# Patient Record
Sex: Female | Born: 1970 | Race: Black or African American | Hispanic: No | Marital: Married | Smoking: Never smoker
Health system: Southern US, Community
[De-identification: ages and names within clinical notes are randomized; demographics above are authoritative.]

## PROBLEM LIST (undated history)

## (undated) DIAGNOSIS — K219 Gastro-esophageal reflux disease without esophagitis: Secondary | ICD-10-CM

## (undated) DIAGNOSIS — I10 Essential (primary) hypertension: Secondary | ICD-10-CM

## (undated) DIAGNOSIS — R7303 Prediabetes: Secondary | ICD-10-CM

## (undated) DIAGNOSIS — D219 Benign neoplasm of connective and other soft tissue, unspecified: Secondary | ICD-10-CM

## (undated) HISTORY — PX: COLONOSCOPY: SHX5424

---

## 2000-12-27 HISTORY — PX: CHOLECYSTECTOMY: SHX55

## 2006-12-27 HISTORY — PX: INTRAUTERINE DEVICE (IUD) INSERTION: SHX5877

## 2008-07-23 ENCOUNTER — Emergency Department (HOSPITAL_COMMUNITY): Admission: EM | Admit: 2008-07-23 | Discharge: 2008-07-23 | Payer: Self-pay | Admitting: Emergency Medicine

## 2008-10-09 ENCOUNTER — Ambulatory Visit: Payer: Self-pay | Admitting: Family Medicine

## 2008-10-09 LAB — CONVERTED CEMR LAB
Basophils Absolute: 0 10*3/uL (ref 0.0–0.1)
Calcium: 8.8 mg/dL (ref 8.4–10.5)
Cholesterol: 168 mg/dL (ref 0–200)
Eosinophils Absolute: 0.1 10*3/uL (ref 0.0–0.7)
Eosinophils Relative: 1 % (ref 0–5)
HCT: 39.8 % (ref 36.0–46.0)
HDL: 41 mg/dL (ref >39)
Lymphocytes Relative: 46 % (ref 12–46)
Lymphs Abs: 2.2 10*3/uL (ref 0.7–4.0)
Neutrophils Relative %: 44 % (ref 43–77)
Platelets: 410 10*3/uL — ABNORMAL HIGH (ref 150–400)
Potassium: 4.2 meq/L (ref 3.5–5.3)
RDW: 14.2 % (ref 11.5–15.5)
Sodium: 140 meq/L (ref 135–145)
Total CHOL/HDL Ratio: 4.1
VLDL: 20 mg/dL (ref 0–40)
WBC: 4.9 10*3/uL (ref 4.0–10.5)

## 2008-11-27 ENCOUNTER — Other Ambulatory Visit: Admission: RE | Admit: 2008-11-27 | Discharge: 2008-11-27 | Payer: Self-pay | Admitting: Family Medicine

## 2008-11-27 ENCOUNTER — Ambulatory Visit: Payer: Self-pay | Admitting: Family Medicine

## 2008-11-27 ENCOUNTER — Encounter: Payer: Self-pay | Admitting: Family Medicine

## 2009-01-29 ENCOUNTER — Ambulatory Visit: Payer: Self-pay | Admitting: Family Medicine

## 2009-01-29 DIAGNOSIS — H6120 Impacted cerumen, unspecified ear: Secondary | ICD-10-CM | POA: Insufficient documentation

## 2009-02-06 ENCOUNTER — Ambulatory Visit: Payer: Self-pay | Admitting: Family Medicine

## 2009-03-10 ENCOUNTER — Telehealth: Payer: Self-pay | Admitting: Family Medicine

## 2009-03-17 ENCOUNTER — Encounter: Payer: Self-pay | Admitting: Family Medicine

## 2009-04-02 ENCOUNTER — Ambulatory Visit: Payer: Self-pay | Admitting: Family Medicine

## 2011-09-13 ENCOUNTER — Encounter: Payer: Self-pay | Admitting: Family Medicine

## 2011-09-14 ENCOUNTER — Ambulatory Visit (INDEPENDENT_AMBULATORY_CARE_PROVIDER_SITE_OTHER): Payer: Managed Care, Other (non HMO) | Admitting: Family Medicine

## 2011-09-14 ENCOUNTER — Encounter: Payer: Self-pay | Admitting: Family Medicine

## 2011-09-14 ENCOUNTER — Other Ambulatory Visit: Payer: Self-pay | Admitting: Family Medicine

## 2011-09-14 VITALS — BP 130/90 | HR 75 | Resp 16 | Ht 63.0 in | Wt 230.0 lb

## 2011-09-14 DIAGNOSIS — H6120 Impacted cerumen, unspecified ear: Secondary | ICD-10-CM

## 2011-09-14 DIAGNOSIS — M899 Disorder of bone, unspecified: Secondary | ICD-10-CM

## 2011-09-14 DIAGNOSIS — R7301 Impaired fasting glucose: Secondary | ICD-10-CM

## 2011-09-14 DIAGNOSIS — E559 Vitamin D deficiency, unspecified: Secondary | ICD-10-CM

## 2011-09-14 DIAGNOSIS — Z1211 Encounter for screening for malignant neoplasm of colon: Secondary | ICD-10-CM

## 2011-09-14 DIAGNOSIS — E669 Obesity, unspecified: Secondary | ICD-10-CM

## 2011-09-14 DIAGNOSIS — Z23 Encounter for immunization: Secondary | ICD-10-CM

## 2011-09-14 DIAGNOSIS — R5383 Other fatigue: Secondary | ICD-10-CM

## 2011-09-14 DIAGNOSIS — Z139 Encounter for screening, unspecified: Secondary | ICD-10-CM

## 2011-09-14 DIAGNOSIS — R5381 Other malaise: Secondary | ICD-10-CM

## 2011-09-14 DIAGNOSIS — Z0000 Encounter for general adult medical examination without abnormal findings: Secondary | ICD-10-CM

## 2011-09-14 DIAGNOSIS — M949 Disorder of cartilage, unspecified: Secondary | ICD-10-CM

## 2011-09-14 LAB — HEMOCCULT GUIAC POC 1CARD (OFFICE): Fecal Occult Blood, POC: NEGATIVE

## 2011-09-14 NOTE — Patient Instructions (Addendum)
F/u end January.  It is important that you exercise regularly at least 30 minutes 5 times a week. If you develop chest pain, have severe difficulty breathing, or feel very tired, stop exercising immediately and seek medical attention   A healthy diet is rich in fruit, vegetables and whole grains. Poultry fish, nuts and beans are a healthy choice for protein rather then red meat. A low sodium diet and drinking 64 ounces of water daily is generally recommended. Oils and sweet should be limited. Carbohydrates especially for those who are diabetic or overweight, should be limited to 30-45 gram per meal. It is important to eat on a regular schedule, at least 3 times daily. Snacks should be primarily fruits, vegetables or nuts.   Weight loss goal of approximately 12 pounds in the next 4 months.  Log in to the computer app "My fitness pal" this is free and helps with calorie counting.  We will provide a 1500 cal diet sheet.  You will be referred for individual counseling for weight loss at the hospital.  Fasting labs asap  Mammogram to be scheduled on the way out , and it is important for you to start monthly breast exams

## 2011-09-15 LAB — CBC WITH DIFFERENTIAL/PLATELET
Basophils Absolute: 0 10*3/uL (ref 0.0–0.1)
Basophils Relative: 0 % (ref 0–1)
Eosinophils Absolute: 0.1 10*3/uL (ref 0.0–0.7)
Eosinophils Relative: 1 % (ref 0–5)
HCT: 42.5 % (ref 36.0–46.0)
MCH: 29.2 pg (ref 26.0–34.0)
MCHC: 33.6 g/dL (ref 30.0–36.0)
MCV: 86.9 fL (ref 78.0–100.0)
Monocytes Absolute: 0.4 10*3/uL (ref 0.1–1.0)
Platelets: 415 10*3/uL — ABNORMAL HIGH (ref 150–400)
RDW: 13.2 % (ref 11.5–15.5)

## 2011-09-15 LAB — LIPID PANEL
HDL: 38 mg/dL — ABNORMAL LOW (ref >39)
LDL Cholesterol: 90 mg/dL (ref 0–99)
Total CHOL/HDL Ratio: 3.8 Ratio
Triglycerides: 74 mg/dL (ref <150)
VLDL: 15 mg/dL (ref 0–40)

## 2011-09-15 LAB — BASIC METABOLIC PANEL
CO2: 28 mEq/L (ref 19–32)
Calcium: 9.5 mg/dL (ref 8.4–10.5)
Creat: 0.79 mg/dL (ref 0.50–1.10)

## 2011-09-15 LAB — HEMOGLOBIN A1C: Mean Plasma Glucose: 117 mg/dL — ABNORMAL HIGH (ref <117)

## 2011-09-16 ENCOUNTER — Ambulatory Visit (HOSPITAL_COMMUNITY)
Admission: RE | Admit: 2011-09-16 | Discharge: 2011-09-16 | Disposition: A | Discharge status: Home / Self Care | Payer: Managed Care, Other (non HMO) | Source: Ambulatory Visit | Attending: Family Medicine | Admitting: Family Medicine

## 2011-09-16 DIAGNOSIS — Z139 Encounter for screening, unspecified: Secondary | ICD-10-CM

## 2011-09-16 DIAGNOSIS — E559 Vitamin D deficiency, unspecified: Secondary | ICD-10-CM | POA: Insufficient documentation

## 2011-09-16 DIAGNOSIS — Z1231 Encounter for screening mammogram for malignant neoplasm of breast: Secondary | ICD-10-CM | POA: Insufficient documentation

## 2011-09-16 MED ORDER — VITAMIN D3 1.25 MG (50000 UT) PO CAPS: 50000.0000 [IU] | ORAL_CAPSULE | ORAL | Status: DC

## 2011-09-16 NOTE — Assessment & Plan Note (Signed)
New diagnosis, will start replacement

## 2011-09-16 NOTE — Progress Notes (Signed)
  Subjective:    Patient ID: Tasha Valencia, female    DOB: March 08, 1971, 40 y.o.   MRN: [id number]  HPI The PT is here for annual exam. She has not been seen for over 1 year, and her only known health concern is morbid obesity. She has been attempting dietary change with a little weight loss success, she does not exercise regularly. She intends to work at changing both There are no new concerns.  There are no specific complaints       Review of Systems Denies recent fever or chills. Denies sinus pressure, nasal congestion, ear pain or sore throat. Denies chest congestion, productive cough or wheezing. Denies chest pains, palpitations and leg swelling Denies abdominal pain, nausea, vomiting,diarrhea or constipation.   Denies dysuria, frequency, hesitancy or incontinence. Denies joint pain, swelling and limitation in mobility. Denies headaches, seizures, numbness, or tingling. Denies depression, anxiety or insomnia. Denies skin break down or rash.        Objective:   Physical Exam Pleasant well nourished female, alert and oriented x 3, in no cardio-pulmonary distress. Afebrile. HEENT No facial trauma or asymetry. Sinuses non tender.  EOMI, PERTL, fundoscopic exam is normal, no hemorhage or exudate.  External ears normal, tympanic membranes clear. Oropharynx moist, no exudate, good dentition. Neck: supple, no adenopathy,JVD or thyromegaly.No bruits.  Chest: Clear to ascultation bilaterally.No crackles or wheezes. Non tender to palpation  Breast: No asymetry,no masses. No nipple discharge or inversion. No axillary or supraclavicular adenopathy  Cardiovascular system; Heart sounds normal,  S1 and  S2 ,no S3.  No murmur, or thrill. Apical beat not displaced Peripheral pulses normal.  Abdomen: Soft, non tender, no organomegaly or masses. No bruits. Bowel sounds normal. No guarding, tenderness or rebound.  Rectal:  No mass. Guaiac negative stool.  GU: External  genitalia normal. No lesions. Vaginal canal normal.No discharge. Uterus normal size, no adnexal masses, no cervical motion or adnexal tenderness.  Musculoskeletal exam: Full ROM of spine, hips , shoulders and knees. No deformity ,swelling or crepitus noted. No muscle wasting or atrophy.   Neurologic: Cranial nerves 2 to 12 intact. Power, tone ,sensation and reflexes normal throughout. No disturbance in gait. No tremor.  Skin: Intact, no ulceration, erythema , scaling or rash noted. Pigmentation normal throughout  Psych; Normal mood and affect. Judgement and concentration normal        Assessment & Plan:

## 2011-09-16 NOTE — Assessment & Plan Note (Signed)
Advised use of softener, and pt to have ear irrigation done at another visit

## 2011-09-16 NOTE — Assessment & Plan Note (Signed)
Counseled re lifestyle change needed for weight loss, pt is motivated

## 2011-09-17 ENCOUNTER — Other Ambulatory Visit: Payer: Self-pay | Admitting: Family Medicine

## 2011-09-17 LAB — T3, FREE: T3, Free: 2.9 pg/mL (ref 2.3–4.2)

## 2011-09-17 LAB — T4: T4, Total: 9.3 ug/dL (ref 5.0–12.5)

## 2011-09-24 LAB — URINALYSIS, ROUTINE W REFLEX MICROSCOPIC
Bilirubin Urine: NEGATIVE
Ketones, ur: NEGATIVE
Protein, ur: NEGATIVE
Urobilinogen, UA: 0.2

## 2011-10-01 NOTE — Progress Notes (Signed)
Addended by: Abner Greenspan on: 10/01/2011 01:27 PM   Modules accepted: Orders

## 2011-11-02 ENCOUNTER — Encounter (HOSPITAL_COMMUNITY): Payer: Self-pay | Admitting: Dietician

## 2011-11-02 NOTE — Progress Notes (Signed)
Outpatient Initial Nutrition Assessment  Date:11/02/2011   Time: 1600  Referring Physician: Dr Lodema Hong [facility]) Reason for Visit: Obesity/ Weight loss  Nutrition Assessment:  Ht: 63" Wt: 233# IBW: 115# %IBW: 203% UBW: 233# %UBW: 100# Goal Weight: 199# BMI: 41.28 Weight hx: Pt's highest weight is current weight of 233#. Lowest weight was 219# about 2 years ago. Pt reports that weight has been fluctuating, but trending up for the past 2 years.   Estimated nutritional needs:  1949-2126 kcals daily, 85-106 grams protein daily, 1.9-2.1 L fluid daily  PMH: Morbid obesity Medications: None, per pt Labs: BP: 130/90, Hgb A1c: 5.7, Total Cholesterol: 143, HDL: 38, LDL: 90, Triglycerides: 74  Nutrition hx/habits: Pt desires weight loss. Reports that she has tried different weight loss strategies over the years, but never found anything that stuck or had long term success. Pt has tried diet pills and cut out sodas. Pt also tried Weight Watchers 8 years ago, but discontinued the membership due to financial obligations.  Pt also struggles with commitment to physical activity. Reports during the summer she likes to walk at [facility]. She has a fitness center at her apartment complex that she has started using, but she does not feel motivated to exercise. Reports that she completed 20 minutes on the treadmill and 10 minutes using weights yesterday.  24 hour diet recall: Wake up 6:45-7AM; Breakfast (9:30 AM): bowl of cereal (honey nut cheerios, Trix, Honey Comb) and 2% milk; Lunch (1:30-2PM): ham and cheese sandwich and potato chips OR out ([employer], [place], or pizza); Dinner (6-7 PM): bbq meat with corn or mashed potatoes OR spaghetti/lasagna with salad; Snack (before 9 PM): chips or ice cream cone. Beverages: 2 glasses of sweet tea daily and 4 bottles of water daily. Reports to eating out 3 x a week.   Nutrition Diagnosis: Involuntary weight gain r/t sedentary lifestyle, diet high in  fat, sodium, and refined sugars AEB 19#, 6% weight gain x 2 years, BMI: 41.28.  Nutrition Intervention Nutrition rx: 1500 calorie NAS, no added sugar diet; 3 meals/day; calorie free beverages only; fruit or vegetables for snacks; 30 minutes physical activity 5 times per week   Education/Counseling Provided: Educated pt on weight management strategies including portion control, lean meats, increased fruit and vegetable intake, healthy food preparation methods, plate method, healthy substitutions/ alternatives, and importance of increased physical activity along with diet interventions to assist with weight loss. Counseled on slow, moderate weight loss (1-2#/ week- will take about 1 year to achieve her goal of under 200#). Discussed importance of accountability to keep on track. Showed pt functionality of My Fitness Pal. Provided 1500 Calorie Diet, Weight Loss Tips, and Portion Distortion handouts.   Understanding, Motivation, Ability to Follow Recommendations: Fair. Pt receptive to recommendations, but will need to include regular physical activity into her lifestyle to achieve weight loss goals.   Monitoring and Evaluation: Goals: 1) 1-2 # weight loss per week; 2) 30 minutes of physical activity 5 x per week  Recommendations: 1) For weight loss: 1449-1626 kcals daily; 2) Keep food and exercise record (ex. My Fitness Pal); 3) Consider gym membership  F/U: PRN; RD contact information provided  Orlene Plum, RD  11/02/2011  Time: 1600

## 2012-01-12 ENCOUNTER — Encounter: Payer: Self-pay | Admitting: Family Medicine

## 2012-01-13 ENCOUNTER — Ambulatory Visit: Payer: Medicare HMO | Admitting: Family Medicine

## 2012-02-07 ENCOUNTER — Encounter: Payer: Self-pay | Admitting: Family Medicine

## 2012-02-07 ENCOUNTER — Ambulatory Visit (INDEPENDENT_AMBULATORY_CARE_PROVIDER_SITE_OTHER): Payer: Managed Care, Other (non HMO) | Admitting: Family Medicine

## 2012-02-07 VITALS — BP 130/90 | HR 62 | Resp 16 | Ht 63.0 in | Wt 239.0 lb

## 2012-02-07 DIAGNOSIS — E785 Hyperlipidemia, unspecified: Secondary | ICD-10-CM

## 2012-02-07 DIAGNOSIS — R7309 Other abnormal glucose: Secondary | ICD-10-CM

## 2012-02-07 DIAGNOSIS — I10 Essential (primary) hypertension: Secondary | ICD-10-CM | POA: Insufficient documentation

## 2012-02-07 DIAGNOSIS — R7303 Prediabetes: Secondary | ICD-10-CM | POA: Insufficient documentation

## 2012-02-07 DIAGNOSIS — R030 Elevated blood-pressure reading, without diagnosis of hypertension: Secondary | ICD-10-CM

## 2012-02-07 DIAGNOSIS — IMO0001 Reserved for inherently not codable concepts without codable children: Secondary | ICD-10-CM

## 2012-02-07 DIAGNOSIS — E559 Vitamin D deficiency, unspecified: Secondary | ICD-10-CM

## 2012-02-07 NOTE — Patient Instructions (Signed)
CPE  in 3 month and 3 weeks  Start phentermine half once daily  End of  March hBa1C, vit D and tSH non fasting  It is important that you exercise regularly at least 30 minutes 5 times a week.Working up to 60 mins 5 days per week. If you develop chest pain, have severe difficulty breathing, or feel very tired, stop exercising immediately and seek medical attention   A healthy diet is rich in fruit, vegetables and whole grains. Poultry fish, nuts and beans are a healthy choice for protein rather then red meat. A low sodium diet and drinking 64 ounces of water daily is generally recommended. Oils and sweet should be limited. Carbohydrates especially for those who are diabetic or overweight, should be limited to 34-45 gram per meal. It is important to eat on a regular schedule, at least 3 times daily. Snacks should be primarily fruits, vegetables or nuts.   Weight loss goal of at least 3 pounds per month.  pls follow low fat diet   pls use my fitness pal

## 2012-02-07 NOTE — Progress Notes (Signed)
  Subjective:    Patient ID: Tasha Valencia, female    DOB: 1971/12/03, 41 y.o.   MRN: [id number]  HPI The PT is here for follow up and re-evaluation of chronic medical conditions, medication management and review of any available recent lab and radiology data.  Preventive health is updated, specifically  Cancer screening and Immunization.   Pt recently had a health screen where her cholesterol was elevated as was her blood pressure, I was notified of this and her need to return to the office for ongoing care. She has not stuck with behavior modification and has gained weight , plans to change this     Review of Systems See HPI Denies recent fever or chills. Denies sinus pressure, nasal congestion, ear pain or sore throat. Denies chest congestion, productive cough or wheezing. Denies chest pains, palpitations and leg swelling Denies abdominal pain, nausea, vomiting,diarrhea or constipation.   Denies dysuria, frequency, hesitancy or incontinence. Denies joint pain, swelling and limitation in mobility. Denies headaches, seizures, numbness, or tingling. Denies depression, anxiety or insomnia. Denies skin break down or rash.        Objective:   Physical Exam  Patient alert and oriented and in no cardiopulmonary distress.  HEENT: No facial asymmetry, EOMI, no sinus tenderness,  oropharynx pink and moist.  Neck supple no adenopathy.  Chest: Clear to auscultation bilaterally.  CVS: S1, S2 no murmurs, no S3.  ABD: Soft non tender. Bowel sounds normal.  Ext: No edema  MS: Adequate ROM spine, shoulders, hips and knees.  Skin: Intact, no ulcerations or rash noted.  Psych: Good eye contact, normal affect. Memory intact not anxious or depressed appearing.  CNS: CN 2-12 intact, power, tone and sensation normal throughout.       Assessment & Plan:

## 2012-02-07 NOTE — Assessment & Plan Note (Signed)
Pt has low HDL, is morbidly obese and is prediabetic, the importance of regular exercise to improve this is discussed

## 2012-02-07 NOTE — Assessment & Plan Note (Signed)
Single elevated BP at this visit. DASH diet and the impt of regular exercise and weight loss is stressed

## 2012-02-07 NOTE — Assessment & Plan Note (Signed)
Deteriorated. Patient re-educated about  the importance of commitment to a  minimum of 150 minutes of exercise per week. The importance of healthy food choices with portion control discussed. Encouraged to start a food diary, count calories and to consider  joining a support group. Sample diet sheets offered. Goals set by the patient for the next several months.   Pt to resume phentermine half daily as a "kick start" also

## 2012-02-07 NOTE — Assessment & Plan Note (Signed)
On weekly vit D rept lab next month

## 2012-02-07 NOTE — Assessment & Plan Note (Addendum)
Pt has gained weight and has not stuck to behavioral modification, she understands the absolute need to do both Rept HBa1C next month

## 2012-02-10 ENCOUNTER — Other Ambulatory Visit: Payer: Self-pay

## 2012-02-10 ENCOUNTER — Telehealth: Payer: Self-pay | Admitting: Family Medicine

## 2012-02-10 MED ORDER — PHENTERMINE HCL 37.5 MG PO TABS: 37.5000 mg | ORAL_TABLET | Freq: Every day | ORAL | Status: DC

## 2012-02-10 NOTE — Telephone Encounter (Signed)
rx awaiting signature by dr.

## 2012-05-31 LAB — HEMOGLOBIN A1C
Hgb A1c MFr Bld: 5.4 % (ref <5.7)
Mean Plasma Glucose: 108 mg/dL (ref <117)

## 2012-05-31 LAB — TSH: TSH: 0.917 u[IU]/mL (ref 0.350–4.500)

## 2012-06-01 ENCOUNTER — Ambulatory Visit (INDEPENDENT_AMBULATORY_CARE_PROVIDER_SITE_OTHER): Payer: Managed Care, Other (non HMO) | Admitting: Family Medicine

## 2012-06-01 ENCOUNTER — Encounter: Payer: Self-pay | Admitting: Family Medicine

## 2012-06-01 ENCOUNTER — Other Ambulatory Visit (HOSPITAL_COMMUNITY)
Admission: RE | Admit: 2012-06-01 | Discharge: 2012-06-01 | Disposition: A | Discharge status: Home / Self Care | Payer: Managed Care, Other (non HMO) | Source: Ambulatory Visit | Attending: Family Medicine | Admitting: Family Medicine

## 2012-06-01 VITALS — BP 122/84 | HR 71 | Resp 16 | Ht 63.0 in | Wt 235.8 lb

## 2012-06-01 DIAGNOSIS — Z0000 Encounter for general adult medical examination without abnormal findings: Secondary | ICD-10-CM

## 2012-06-01 DIAGNOSIS — Z1211 Encounter for screening for malignant neoplasm of colon: Secondary | ICD-10-CM

## 2012-06-01 DIAGNOSIS — Z01419 Encounter for gynecological examination (general) (routine) without abnormal findings: Secondary | ICD-10-CM | POA: Insufficient documentation

## 2012-06-01 DIAGNOSIS — Z975 Presence of (intrauterine) contraceptive device: Secondary | ICD-10-CM

## 2012-06-01 DIAGNOSIS — R7309 Other abnormal glucose: Secondary | ICD-10-CM

## 2012-06-01 DIAGNOSIS — E559 Vitamin D deficiency, unspecified: Secondary | ICD-10-CM

## 2012-06-01 DIAGNOSIS — R7303 Prediabetes: Secondary | ICD-10-CM

## 2012-06-01 LAB — VITAMIN D 25 HYDROXY (VIT D DEFICIENCY, FRACTURES): Vit D, 25-Hydroxy: 22 ng/mL — ABNORMAL LOW (ref 30–89)

## 2012-06-01 LAB — POC HEMOCCULT BLD/STL (OFFICE/1-CARD/DIAGNOSTIC): Fecal Occult Blood, POC: NEGATIVE

## 2012-06-01 MED ORDER — ERGOCALCIFEROL 1.25 MG (50000 UT) PO CAPS: 50000.0000 [IU] | ORAL_CAPSULE | ORAL | Status: DC

## 2012-06-01 NOTE — Progress Notes (Signed)
  Subjective:    Patient ID: Tasha Valencia, female    DOB: 06/20/1971, 41 y.o.   MRN: [id number]  HPI The PT is here for annual exam and re-evaluation of chronic medical conditions, medication management and review of any available recent lab and radiology data.  Preventive health is updated, specifically  Cancer screening and Immunization.   Questions or concerns regarding consultations or procedures which the PT has had in the interim are  addressed. The PT denies any adverse reactions to current medications since the last visit.  There are no new concerns.  There are no specific complaints except that she is disappointed in her inability to stick with lifestyle changes to improve health to date, but will still work on this      Review of Systems See HPI Denies recent fever or chills. Denies sinus pressure, nasal congestion, ear pain or sore throat. Denies chest congestion, productive cough or wheezing. Denies chest pains, palpitations and leg swelling Denies abdominal pain, nausea, vomiting,diarrhea or constipation.   Denies dysuria, frequency, hesitancy or incontinence. Denies joint pain, swelling and limitation in mobility. Denies headaches, seizures, numbness, or tingling. Denies depression, anxiety or insomnia. Denies skin break down or rash.        Objective:   Physical Exam  Pleasant obese female, alert and oriented x 3, in no cardio-pulmonary distress. Afebrile. HEENT No facial trauma or asymetry. Sinuses non tender.  EOMI, PERTL, fundoscopic exam  no hemorhage or exudate.  External ears normal, tympanic membranes clear. Oropharynx moist, no exudate, good dentition. Neck: supple, no adenopathy,JVD or thyromegaly.No bruits.  Chest: Clear to ascultation bilaterally.No crackles or wheezes. Non tender to palpation  Breast: No asymetry,no masses. No nipple discharge or inversion. No axillary or supraclavicular adenopathy  Cardiovascular system; Heart sounds  normal,  S1 and  S2 ,no S3.  No murmur, or thrill. Apical beat not displaced Peripheral pulses normal.  Abdomen: Soft, non tender, no organomegaly or masses. No bruits. Bowel sounds normal. No guarding, tenderness or rebound.  Rectal:  No mass. Guaiac negative stool.  GU: External genitalia normal. No lesions. Vaginal canal normal.No discharge. Uterus normal size, no adnexal masses, no cervical motion or adnexal tenderness.  Musculoskeletal exam: Full ROM of spine, hips , shoulders and knees. No deformity ,swelling or crepitus noted. No muscle wasting or atrophy.   Neurologic: Cranial nerves 2 to 12 intact. Power, tone ,sensation and reflexes normal throughout. No disturbance in gait. No tremor.  Skin: Intact, no ulceration, erythema , scaling or rash noted. Pigmentation normal throughout  Psych; Normal mood and affect. Judgement and concentration normal       Assessment & Plan:

## 2012-06-01 NOTE — Patient Instructions (Signed)
F/u in 4 month  Congrats on normal blood sugar average, this is an improvement.  Please work on continued changes in eating to improve your health , as discussed.  Please commit to at least 30 minutes of exercise 5 days per week.  Weight loss goal of at least 2.5 pounds each month for the next 4 month  You are referred for IUD removal and re insertion   Vitamin D once weekly, also if your multivitamin does not have vit D in it start over the counter vitamiin D tablets 600iU daily after you finish the weekly script in 12 weeks  No labs before next visit

## 2012-06-03 NOTE — Assessment & Plan Note (Signed)
Counseled re importance of 30 minutes exercise daily, and change in nutrition to increase fruit and vegetable intake and also adequate water intake. Weight loss goals set. Safety also discussed specifically seat belt use

## 2012-06-03 NOTE — Assessment & Plan Note (Signed)
Importance of supplementation to normalize value stressed

## 2012-06-03 NOTE — Assessment & Plan Note (Signed)
Improved HBA1C

## 2012-10-25 ENCOUNTER — Other Ambulatory Visit: Payer: Self-pay | Admitting: Family Medicine

## 2012-10-25 DIAGNOSIS — Z139 Encounter for screening, unspecified: Secondary | ICD-10-CM

## 2012-10-31 ENCOUNTER — Ambulatory Visit (HOSPITAL_COMMUNITY)
Admission: RE | Admit: 2012-10-31 | Discharge: 2012-10-31 | Disposition: A | Discharge status: Home / Self Care | Payer: Managed Care, Other (non HMO) | Source: Ambulatory Visit | Attending: Family Medicine | Admitting: Family Medicine

## 2012-10-31 DIAGNOSIS — Z1231 Encounter for screening mammogram for malignant neoplasm of breast: Secondary | ICD-10-CM | POA: Insufficient documentation

## 2012-10-31 DIAGNOSIS — Z139 Encounter for screening, unspecified: Secondary | ICD-10-CM

## 2013-04-09 ENCOUNTER — Encounter: Payer: Self-pay | Admitting: Family Medicine

## 2013-04-09 ENCOUNTER — Ambulatory Visit (INDEPENDENT_AMBULATORY_CARE_PROVIDER_SITE_OTHER): Payer: Managed Care, Other (non HMO) | Admitting: Family Medicine

## 2013-04-09 VITALS — BP 140/94 | HR 86 | Resp 18 | Ht 63.0 in | Wt 241.1 lb

## 2013-04-09 DIAGNOSIS — R7309 Other abnormal glucose: Secondary | ICD-10-CM

## 2013-04-09 DIAGNOSIS — E785 Hyperlipidemia, unspecified: Secondary | ICD-10-CM

## 2013-04-09 DIAGNOSIS — E559 Vitamin D deficiency, unspecified: Secondary | ICD-10-CM

## 2013-04-09 DIAGNOSIS — R7303 Prediabetes: Secondary | ICD-10-CM

## 2013-04-09 DIAGNOSIS — IMO0001 Reserved for inherently not codable concepts without codable children: Secondary | ICD-10-CM

## 2013-04-09 DIAGNOSIS — R030 Elevated blood-pressure reading, without diagnosis of hypertension: Secondary | ICD-10-CM

## 2013-04-09 NOTE — Assessment & Plan Note (Signed)
Patient educated about the importance of limiting  Carbohydrate intake , the need to commit to daily physical activity for a minimum of 30 minutes , and to commit weight loss. The fact that changes in all these areas will reduce or eliminate all together the development of diabetes is stressed.   Updated lab needed 

## 2013-04-09 NOTE — Assessment & Plan Note (Signed)
Remains elevated at this visit, if still abnormal at next visit, pt to start meds, has been on meds in the past  DASH diet and commitment to daily physical activity for a minimum of 30 minutes discussed and encouraged, as a part of hypertension management. The importance of attaining a healthy weight is also discussed.

## 2013-04-09 NOTE — Assessment & Plan Note (Signed)
Updated lab needed,has been treated in the past

## 2013-04-09 NOTE — Progress Notes (Signed)
  Subjective:    Patient ID: Tasha Valencia, female    DOB: 09-18-1971, 42 y.o.   MRN: [id number]  HPI The PT is here for follow up and re-evaluation of chronic medical conditions, medication management and review of any available recent lab and radiology data.  Preventive health is updated, specifically  Cancer screening and Immunization.   Questions or concerns regarding consultations or procedures which the PT has had in the interim are  Addressed.States she was recently alerted through screening on her job that she may be diabetic, seems surprised though the fact that she has been prediabetic has been documented in the past. She has started exercising 3 days per week, diet is inconsistent Approx 10 mins is spent re educating about the direct relationship between lifestyle and the development of disease, in the hope that she will make positive changes The PT denies any adverse reactions to current medications since the last visit.  There are no new concerns.  There are no specific complaints       Review of Systems See HPI Denies recent fever or chills. Denies sinus pressure, nasal congestion, ear pain or sore throat. Denies chest congestion, productive cough or wheezing. Denies chest pains, palpitations and leg swelling Denies abdominal pain, nausea, vomiting,diarrhea or constipation.   Denies dysuria, frequency, hesitancy or incontinence. Denies joint pain, swelling and limitation in mobility. Denies headaches, seizures, numbness, or tingling. Denies depression, anxiety or insomnia. Denies skin break down or rash.        Objective:   Physical Exam Patient alert and oriented and in no cardiopulmonary distress.  HEENT: No facial asymmetry, EOMI, no sinus tenderness,  oropharynx pink and moist.  Neck supple no adenopathy.  Chest: Clear to auscultation bilaterally.  CVS: S1, S2 no murmurs, no S3.  ABD: Soft non tender. Bowel sounds normal.  Ext: No edema  MS: Adequate  ROM spine, shoulders, hips and knees.  Skin: Intact, no ulcerations or rash noted.  Psych: Good eye contact, normal affect. Memory intact not anxious or depressed appearing.  CNS: CN 2-12 intact, power, tone and sensation normal throughout.        Assessment & Plan:

## 2013-04-09 NOTE — Assessment & Plan Note (Signed)
Unchanged. Patient re-educated about  the importance of commitment to a  minimum of 150 minutes of exercise per week. The importance of healthy food choices with portion control discussed. Encouraged to start a food diary, count calories and to consider  joining a support group. Sample diet sheets offered. Goals set by the patient for the next several months.    

## 2013-04-09 NOTE — Assessment & Plan Note (Signed)
Hyperlipidemia:Low fat diet discussed and encouraged.  Updated lab asap 

## 2013-04-09 NOTE — Patient Instructions (Addendum)
F/u in 4 month   Please count calories to help with weight loss, goal is 1200 to 1500 cal per day    It is important that you exercise regularly at least 30 minutes 5 times a week. If you develop chest pain, have severe difficulty breathing, or feel very tired, stop exercising immediately and seek medical attention    Fasting lipid, chem 7, CBC, hBA1C , TSH, and vit D in the morning  All the best  Blood pressure today is also slightly elevated

## 2013-04-11 LAB — BASIC METABOLIC PANEL
CO2: 24 mEq/L (ref 19–32)
Glucose, Bld: 91 mg/dL (ref 70–99)
Potassium: 4.1 mEq/L (ref 3.5–5.3)
Sodium: 138 mEq/L (ref 135–145)

## 2013-04-11 LAB — CBC
HCT: 39.9 % (ref 36.0–46.0)
MCV: 82.8 fL (ref 78.0–100.0)
RDW: 13.6 % (ref 11.5–15.5)
WBC: 5 10*3/uL (ref 4.0–10.5)

## 2013-04-11 LAB — LIPID PANEL: Total CHOL/HDL Ratio: 3.7 Ratio

## 2013-04-11 LAB — TSH: TSH: 0.739 u[IU]/mL (ref 0.350–4.500)

## 2013-08-10 ENCOUNTER — Ambulatory Visit: Payer: Managed Care, Other (non HMO) | Admitting: Family Medicine

## 2013-08-10 ENCOUNTER — Encounter: Payer: Self-pay | Admitting: Family Medicine

## 2013-08-16 ENCOUNTER — Ambulatory Visit: Payer: Managed Care, Other (non HMO) | Admitting: Family Medicine

## 2013-09-19 ENCOUNTER — Other Ambulatory Visit: Payer: Self-pay

## 2013-09-19 DIAGNOSIS — Z1231 Encounter for screening mammogram for malignant neoplasm of breast: Secondary | ICD-10-CM

## 2013-10-15 ENCOUNTER — Encounter: Payer: Self-pay | Admitting: Family Medicine

## 2013-10-15 ENCOUNTER — Ambulatory Visit (INDEPENDENT_AMBULATORY_CARE_PROVIDER_SITE_OTHER): Payer: Managed Care, Other (non HMO) | Admitting: Family Medicine

## 2013-10-15 ENCOUNTER — Encounter (INDEPENDENT_AMBULATORY_CARE_PROVIDER_SITE_OTHER): Payer: Self-pay

## 2013-10-15 VITALS — BP 120/72 | HR 70 | Resp 18 | Ht 63.0 in | Wt 242.1 lb

## 2013-10-15 DIAGNOSIS — Z23 Encounter for immunization: Secondary | ICD-10-CM

## 2013-10-15 DIAGNOSIS — E785 Hyperlipidemia, unspecified: Secondary | ICD-10-CM

## 2013-10-15 DIAGNOSIS — E559 Vitamin D deficiency, unspecified: Secondary | ICD-10-CM

## 2013-10-15 DIAGNOSIS — R7309 Other abnormal glucose: Secondary | ICD-10-CM

## 2013-10-15 DIAGNOSIS — R7303 Prediabetes: Secondary | ICD-10-CM

## 2013-10-15 LAB — HEMOGLOBIN A1C: Mean Plasma Glucose: 114 mg/dL (ref <117)

## 2013-10-15 MED ORDER — PHENTERMINE HCL 37.5 MG PO TBDP: 1.0000 | ORAL_TABLET | Freq: Every day | ORAL | Status: DC

## 2013-10-15 NOTE — Progress Notes (Signed)
  Subjective:    Patient ID: Tasha Valencia, female    DOB: 03-03-1971, 42 y.o.   MRN: [id number]  HPI The PT is here for follow up and re-evaluation of chronic medical conditions,  and review of any available recent lab and radiology data.  Preventive health is updated, specifically  Cancer screening and Immunization.    Concerned about lack of weigh despite behavioral changes per repot , wants to work more aggressively on this as well as et medication help for a[ppetite suppress. Has no know heart history    Review of Systems See HPI Denies recent fever or chills. Denies sinus pressure, nasal congestion, ear pain or sore throat. Denies chest congestion, productive cough or wheezing. Denies chest pains, palpitations and leg swelling Denies abdominal pain, nausea, vomiting,diarrhea or constipation.   Denies dysuria, frequency, hesitancy or incontinence. Denies joint pain, swelling and limitation in mobility. Denies headaches, seizures, numbness, or tingling. Denies depression, anxiety or insomnia. Denies skin break down or rash.        Objective:   Physical Exam Patient alert and oriented and in no cardiopulmonary distress.  HEENT: No facial asymmetry, EOMI, no sinus tenderness,  oropharynx pink and moist.  Neck supple no adenopathy.  Chest: Clear to auscultation bilaterally.  CVS: S1, S2 no murmurs, no S3.  ABD: Soft non tender. Bowel sounds normal.  Ext: No edema  MS: Adequate ROM spine, shoulders, hips and knees.  Skin: Intact, no ulcerations or rash noted.  Psych: Good eye contact, normal affect. Memory intact not anxious or depressed appearing.  CNS: CN 2-12 intact, power, tone and sensation normal throughout.        Assessment & Plan:

## 2013-10-15 NOTE — Patient Instructions (Addendum)
F/u in 4 month, call if you need me before   Flu vaccine today  Eat at set times at least 3 times every day, all snacks need to be unprocessed fresh or frozen  Fruit or veg  60 ounces water daily  30 minutes total of physical activity every day  Weight loss goal of 4 to 5 pounds per month  Phentermine half daily  HBA1c , vit D level today

## 2013-10-16 ENCOUNTER — Encounter: Payer: Self-pay | Admitting: Family Medicine

## 2013-10-16 LAB — VITAMIN D 25 HYDROXY (VIT D DEFICIENCY, FRACTURES): Vit D, 25-Hydroxy: 29 ng/mL — ABNORMAL LOW (ref 30–89)

## 2013-11-01 ENCOUNTER — Ambulatory Visit
Admission: RE | Admit: 2013-11-01 | Discharge: 2013-11-01 | Disposition: A | Discharge status: Home / Self Care | Payer: 59 | Source: Ambulatory Visit

## 2013-11-01 DIAGNOSIS — Z1231 Encounter for screening mammogram for malignant neoplasm of breast: Secondary | ICD-10-CM

## 2013-12-10 NOTE — Assessment & Plan Note (Signed)
Low HDL otherwise good, needs to commit to daily exercise

## 2013-12-10 NOTE — Assessment & Plan Note (Signed)
Deteriorated. Patient re-educated about  the importance of commitment to a  minimum of 150 minutes of exercise per week. The importance of healthy food choices with portion control discussed. Encouraged to start a food diary, count calories and to consider  joining a support group. Sample diet sheets offered. Goals set by the patient for the next several months.    

## 2013-12-10 NOTE — Assessment & Plan Note (Signed)
HBA1C corrected when last check Patient educated about the importance of limiting  Carbohydrate intake , the need to commit to daily physical activity for a minimum of 30 minutes , and to commit weight loss. The fact that changes in all these areas will reduce or eliminate all together the development of diabetes is stressed.

## 2014-02-13 ENCOUNTER — Ambulatory Visit: Payer: Managed Care, Other (non HMO) | Admitting: Family Medicine

## 2014-06-26 ENCOUNTER — Encounter: Payer: 59 | Admitting: Family Medicine

## 2014-10-08 ENCOUNTER — Other Ambulatory Visit: Payer: Self-pay

## 2014-10-08 DIAGNOSIS — Z1231 Encounter for screening mammogram for malignant neoplasm of breast: Secondary | ICD-10-CM

## 2014-11-05 ENCOUNTER — Inpatient Hospital Stay: Admission: RE | Admit: 2014-11-05 | Payer: 59 | Source: Ambulatory Visit

## 2014-11-19 ENCOUNTER — Encounter: Payer: 59 | Admitting: Family Medicine

## 2014-11-19 ENCOUNTER — Encounter: Payer: Self-pay | Admitting: *Deleted

## 2014-11-27 ENCOUNTER — Ambulatory Visit
Admission: RE | Admit: 2014-11-27 | Discharge: 2014-11-27 | Disposition: A | Discharge status: Home / Self Care | Payer: 59 | Source: Ambulatory Visit

## 2014-11-27 DIAGNOSIS — Z1231 Encounter for screening mammogram for malignant neoplasm of breast: Secondary | ICD-10-CM

## 2015-06-18 ENCOUNTER — Other Ambulatory Visit: Payer: Self-pay | Admitting: Obstetrics & Gynecology

## 2015-08-20 ENCOUNTER — Encounter (HOSPITAL_BASED_OUTPATIENT_CLINIC_OR_DEPARTMENT_OTHER): Payer: Self-pay | Admitting: *Deleted

## 2015-08-20 NOTE — Progress Notes (Signed)
NPO AFTER MN.  ARRIVE AT 1030.  NEEDS HG AND URINE PREG.

## 2015-08-27 ENCOUNTER — Ambulatory Visit (HOSPITAL_BASED_OUTPATIENT_CLINIC_OR_DEPARTMENT_OTHER): Payer: Managed Care, Other (non HMO) | Admitting: Anesthesiology

## 2015-08-27 ENCOUNTER — Ambulatory Visit (HOSPITAL_BASED_OUTPATIENT_CLINIC_OR_DEPARTMENT_OTHER)
Admission: RE | Admit: 2015-08-27 | Discharge: 2015-08-27 | Disposition: A | Discharge status: Home / Self Care | Payer: Managed Care, Other (non HMO) | Source: Ambulatory Visit | Attending: Obstetrics & Gynecology | Admitting: Obstetrics & Gynecology

## 2015-08-27 ENCOUNTER — Encounter (HOSPITAL_BASED_OUTPATIENT_CLINIC_OR_DEPARTMENT_OTHER): Payer: Self-pay

## 2015-08-27 ENCOUNTER — Encounter (HOSPITAL_BASED_OUTPATIENT_CLINIC_OR_DEPARTMENT_OTHER)
Admission: RE | Disposition: A | Discharge status: Home / Self Care | Payer: Self-pay | Source: Ambulatory Visit | Attending: Obstetrics & Gynecology

## 2015-08-27 DIAGNOSIS — Z30432 Encounter for removal of intrauterine contraceptive device: Secondary | ICD-10-CM | POA: Diagnosis not present

## 2015-08-27 DIAGNOSIS — Z302 Encounter for sterilization: Secondary | ICD-10-CM | POA: Insufficient documentation

## 2015-08-27 HISTORY — PX: LAPAROSCOPIC TUBAL LIGATION: SHX1937

## 2015-08-27 HISTORY — PX: IUD REMOVAL: SHX5392

## 2015-08-27 LAB — POCT HEMOGLOBIN-HEMACUE: HEMOGLOBIN: 12.9 g/dL (ref 12.0–15.0)

## 2015-08-27 LAB — POCT PREGNANCY, URINE: Preg Test, Ur: NEGATIVE

## 2015-08-27 SURGERY — LIGATION, FALLOPIAN TUBE, LAPAROSCOPIC
Anesthesia: General

## 2015-08-27 MED ORDER — KETOROLAC TROMETHAMINE 30 MG/ML IJ SOLN
INTRAMUSCULAR | Status: DC | PRN
  Administered 2015-08-27: 30 mg via INTRAVENOUS

## 2015-08-27 MED ORDER — FENTANYL CITRATE (PF) 100 MCG/2ML IJ SOLN
INTRAMUSCULAR | Status: AC
  Filled 2015-08-27: qty 4

## 2015-08-27 MED ORDER — PROPOFOL 10 MG/ML IV BOLUS
INTRAVENOUS | Status: DC | PRN
  Administered 2015-08-27: 200 mg via INTRAVENOUS

## 2015-08-27 MED ORDER — HYDROMORPHONE HCL 1 MG/ML IJ SOLN
INTRAMUSCULAR | Status: AC
  Filled 2015-08-27: qty 1

## 2015-08-27 MED ORDER — LIDOCAINE HCL (CARDIAC) 20 MG/ML IV SOLN
INTRAVENOUS | Status: DC | PRN
  Administered 2015-08-27: 100 mg via INTRAVENOUS

## 2015-08-27 MED ORDER — OXYCODONE HCL 5 MG PO TABS
ORAL_TABLET | ORAL | Status: AC
  Filled 2015-08-27: qty 1

## 2015-08-27 MED ORDER — IBUPROFEN 800 MG PO TABS: 800.0000 mg | ORAL_TABLET | Freq: Three times a day (TID) | ORAL | Status: DC | PRN

## 2015-08-27 MED ORDER — FENTANYL CITRATE (PF) 100 MCG/2ML IJ SOLN
INTRAMUSCULAR | Status: DC | PRN
  Administered 2015-08-27 (×4): 50 ug via INTRAVENOUS

## 2015-08-27 MED ORDER — BUPIVACAINE HCL 0.25 % IJ SOLN
INTRAMUSCULAR | Status: DC | PRN
  Administered 2015-08-27: 10 mL

## 2015-08-27 MED ORDER — MIDAZOLAM HCL 5 MG/5ML IJ SOLN
INTRAMUSCULAR | Status: DC | PRN
  Administered 2015-08-27: 2 mg via INTRAVENOUS

## 2015-08-27 MED ORDER — OXYCODONE HCL 5 MG PO TABS
5.0000 mg | ORAL_TABLET | Freq: Once | ORAL | Status: AC
  Administered 2015-08-27: 5 mg via ORAL
  Filled 2015-08-27: qty 1

## 2015-08-27 MED ORDER — LACTATED RINGERS IV SOLN
INTRAVENOUS | Status: DC
  Administered 2015-08-27 (×2): via INTRAVENOUS
  Filled 2015-08-27: qty 1000

## 2015-08-27 MED ORDER — DEXAMETHASONE SODIUM PHOSPHATE 4 MG/ML IJ SOLN
INTRAMUSCULAR | Status: DC | PRN
  Administered 2015-08-27: 10 mg via INTRAVENOUS

## 2015-08-27 MED ORDER — ROCURONIUM BROMIDE 100 MG/10ML IV SOLN
INTRAVENOUS | Status: DC | PRN
  Administered 2015-08-27: 30 mg via INTRAVENOUS

## 2015-08-27 MED ORDER — HYDROMORPHONE HCL 1 MG/ML IJ SOLN
0.2500 mg | INTRAMUSCULAR | Status: DC | PRN
  Administered 2015-08-27 (×4): 0.25 mg via INTRAVENOUS
  Filled 2015-08-27: qty 1

## 2015-08-27 MED ORDER — OXYCODONE-ACETAMINOPHEN 5-325 MG PO TABS: 1.0000 | ORAL_TABLET | ORAL | Status: DC | PRN

## 2015-08-27 MED ORDER — ACETAMINOPHEN 10 MG/ML IV SOLN
INTRAVENOUS | Status: DC | PRN
  Administered 2015-08-27: 1000 mg via INTRAVENOUS

## 2015-08-27 MED ORDER — SUCCINYLCHOLINE CHLORIDE 20 MG/ML IJ SOLN
INTRAMUSCULAR | Status: DC | PRN
  Administered 2015-08-27: 100 mg via INTRAVENOUS

## 2015-08-27 MED ORDER — GLYCOPYRROLATE 0.2 MG/ML IJ SOLN
INTRAMUSCULAR | Status: DC | PRN
  Administered 2015-08-27: 0.6 mg via INTRAVENOUS

## 2015-08-27 MED ORDER — MIDAZOLAM HCL 2 MG/2ML IJ SOLN
INTRAMUSCULAR | Status: AC
  Filled 2015-08-27: qty 2

## 2015-08-27 MED ORDER — NEOSTIGMINE METHYLSULFATE 10 MG/10ML IV SOLN
INTRAVENOUS | Status: DC | PRN
  Administered 2015-08-27: 4 mg via INTRAVENOUS

## 2015-08-27 MED ORDER — ONDANSETRON HCL 4 MG/2ML IJ SOLN
INTRAMUSCULAR | Status: DC | PRN
  Administered 2015-08-27: 4 mg via INTRAVENOUS

## 2015-08-27 SURGICAL SUPPLY — 50 items
BAG URINE DRAINAGE (UROLOGICAL SUPPLIES) ×3 IMPLANT
BENZOIN TINCTURE PRP APPL 2/3 (GAUZE/BANDAGES/DRESSINGS) ×3 IMPLANT
BLADE SURG 11 STRL SS (BLADE) ×3 IMPLANT
CATH FOLEY 2WAY SLVR  5CC 14FR (CATHETERS) ×1
CATH FOLEY 2WAY SLVR 5CC 14FR (CATHETERS) ×2 IMPLANT
CATH ROBINSON RED A/P 14FR (CATHETERS) IMPLANT
CATH ROBINSON RED A/P 16FR (CATHETERS) IMPLANT
CLIP FILSHIE TUBAL LIGA STRL (Clip) ×3 IMPLANT
COVER MAYO STAND STRL (DRAPES) ×3 IMPLANT
DRAPE UNDERBUTTOCKS STRL (DRAPE) ×3 IMPLANT
DRSG COVADERM PLUS 2X2 (GAUZE/BANDAGES/DRESSINGS) ×6 IMPLANT
DRSG OPSITE POSTOP 3X4 (GAUZE/BANDAGES/DRESSINGS) ×3 IMPLANT
DURAPREP 26ML APPLICATOR (WOUND CARE) ×3 IMPLANT
ELECT REM PT RETURN 9FT ADLT (ELECTROSURGICAL) ×3
ELECTRODE REM PT RTRN 9FT ADLT (ELECTROSURGICAL) ×2 IMPLANT
GLOVE BIO SURGEON STRL SZ 6.5 (GLOVE) ×3 IMPLANT
GLOVE BIOGEL PI IND STRL 7.0 (GLOVE) ×4 IMPLANT
GLOVE BIOGEL PI INDICATOR 7.0 (GLOVE) ×2
GOWN STRL REUS W/TWL LRG LVL3 (GOWN DISPOSABLE) ×6 IMPLANT
HOLDER FOLEY CATH W/STRAP (MISCELLANEOUS) IMPLANT
LIGASURE 5MM LAPAROSCOPIC (INSTRUMENTS) IMPLANT
LIQUID BAND (GAUZE/BANDAGES/DRESSINGS) ×3 IMPLANT
MANIFOLD NEPTUNE II (INSTRUMENTS) IMPLANT
NEEDLE HYPO 25X1 1.5 SAFETY (NEEDLE) ×3 IMPLANT
NEEDLE INSUFFLATION 120MM (ENDOMECHANICALS) ×3 IMPLANT
NS IRRIG 500ML POUR BTL (IV SOLUTION) ×3 IMPLANT
PACK BASIN DAY SURGERY FS (CUSTOM PROCEDURE TRAY) ×3 IMPLANT
PACK LAPAROSCOPY II (CUSTOM PROCEDURE TRAY) ×3 IMPLANT
PAD OB MATERNITY 4.3X12.25 (PERSONAL CARE ITEMS) ×3 IMPLANT
PAD PREP 24X48 CUFFED NSTRL (MISCELLANEOUS) ×3 IMPLANT
PADDING ION DISPOSABLE (MISCELLANEOUS) ×3 IMPLANT
POUCH SPECIMEN RETRIEVAL 10MM (ENDOMECHANICALS) IMPLANT
SET IRRIG TUBING LAPAROSCOPIC (IRRIGATION / IRRIGATOR) IMPLANT
SOLUTION ANTI FOG 6CC (MISCELLANEOUS) ×3 IMPLANT
SOLUTION ELECTROLUBE (MISCELLANEOUS) IMPLANT
STRIP CLOSURE SKIN 1/4X4 (GAUZE/BANDAGES/DRESSINGS) ×3 IMPLANT
SUT MNCRL AB 4-0 PS2 18 (SUTURE) ×3 IMPLANT
SUT MON AB 4-0 PS1 27 (SUTURE) ×3 IMPLANT
SUT VICRYL 0 UR6 27IN ABS (SUTURE) ×3 IMPLANT
SYR 5ML LL (SYRINGE) IMPLANT
SYR CONTROL 10ML LL (SYRINGE) ×3 IMPLANT
SYRINGE 10CC LL (SYRINGE) ×3 IMPLANT
TOWEL OR 17X24 6PK STRL BLUE (TOWEL DISPOSABLE) ×6 IMPLANT
TRAY DSU PREP LF (CUSTOM PROCEDURE TRAY) ×3 IMPLANT
TROCAR BLADELESS OPT 5 100 (ENDOMECHANICALS) ×6 IMPLANT
TROCAR XCEL NON-BLD 11X100MML (ENDOMECHANICALS) IMPLANT
TROCAR XCEL NON-BLD 5MMX100MML (ENDOMECHANICALS) ×3 IMPLANT
TUBING INSUFFLATION W/FILTER (TUBING) ×3 IMPLANT
WARMER LAPAROSCOPE (MISCELLANEOUS) ×3 IMPLANT
WATER STERILE IRR 500ML POUR (IV SOLUTION) ×3 IMPLANT

## 2015-08-27 NOTE — Op Note (Signed)
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 Diagnostic laparoscopy 2. Laparoscopic Bilateral tubal ligation via Filshie clip applicator 3. Removal of IUD  Surgeon: Mady Haagensen. Alwyn Pea, M.D.  Assistant: None  Anesthetic: Local, General ETA    Fluids: 1500 mL UOP: 50 mL  Catheter: N/A EBL: 0 mL Complications: None  Indication:  Ms. Tasha Valencia is a 44 y.o. year old female,who desires permanent sterilization and previously placed IUD removed. Risks, benefits, alternatives and indication was explained to patient prior to the procedure.   Findings:  Exam under anesthesia: possibly enlarged uterus 12 weeks size, mobile anteverted, exam limited by habitus Laparoscopy: Slightly enlarged uterus. Large left lateral mass adjacent to uterine corpus just obscuring left adnexa. 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  Several adhesions from mass to left pelvic side wall, not dense. Dilated left fallopian tube.  Normal appear ovaries bilaterally, and normal right fallopian tube.  normal liver edge  Procedure After adequate general endotracheal anesthesia was achieved, the patient was prepped and draped in the usual sterile fashion.  A graves speculum was placed in the vagina, the anterior lip of the cervix was held with a single tooth tenaculum. The strings of the IUD were grasped with a ring forceps and the IUD remove without difficulty.  A Hulka uterine manipulator was then attached and the tenaculum removed.  Surgeon's gloves were changed and attention was turned to the abdomen.  11mL of 0.25 Marcaine was injected along the umbilicus. A 2 cm incision was made just below the umbilicus with an 11 blade scalpel and the  abdominal wall tented up.  The 26mm Excel Visiport was used to enter the abdomen under direct visualization. Entry was confirmed and the abdomen was insufflated with approximately 3L CO2 gas. The 14mm 0 degree diagnostic scope was used and a complete survey was performed with findings noted above.  Due to the presence of the mass an accessory left lower abdominal 18mm port was placed under direct visualization.  Attempt was made to use the operative scope to place the Filshie clips unsuccessful. So The 5 mm 0 degree scope was placed in the left lateral accessory port and the Filshie applicator was placed in the 12 mm umbilical port.   The filshie clip instrument was introduced and the tubes were followed to their fimbriated ends bilaterally.  A filchie clip was placed on the isthmic portion of each tube and confirmed visually to be around the entire circumference of each tube.  The 10 mm scope was replaced into the umbilicus and the correct placement was confirmed. The 12 mm umbilicus  fascial incision was closed with a figure of eight stitch of 2-vicryl and the skin was closed with 4-0 monocryl and dermabond.  The 5 mm incision was closed with Dermabond. All instruments were removed from the vagina and the patient returned to the PACU in stable condition.  Tasha Valencia

## 2015-08-27 NOTE — Transfer of Care (Signed)
Immediate Anesthesia Transfer of Care Note  Patient: Tasha Valencia  Procedure(s) Performed: Procedure(s) with comments: LAPAROSCOPIC TUBAL LIGATION (Bilateral) - FILSHIE CLIPS INTRAUTERINE DEVICE (IUD) REMOVAL (N/A)  Patient Location: PACU  Anesthesia Type:General  Level of Consciousness: awake, alert , oriented and patient cooperative  Airway & Oxygen Therapy: Patient Spontanous Breathing and Patient connected to face mask oxygen  Post-op Assessment: Report given to RN and Post -op Vital signs reviewed and stable  Post vital signs: Reviewed and stable  Last Vitals:  Filed Vitals:   08/27/15 1027  BP: 147/79  Pulse: 76  Temp: 37.1 C  Resp: 16    Complications: No apparent anesthesia complications

## 2015-08-27 NOTE — Anesthesia Preprocedure Evaluation (Addendum)
Anesthesia Evaluation  Patient identified by MRN, date of birth, ID band Patient awake    Reviewed: Allergy & Precautions, NPO status , Patient's Chart, lab work & pertinent test results  Airway Mallampati: II  TM Distance: >3 FB Neck ROM: Full    Dental   Pulmonary neg pulmonary ROS,  breath sounds clear to auscultation        Cardiovascular negative cardio ROS  Rhythm:Regular Rate:Normal     Neuro/Psych    GI/Hepatic negative GI ROS, Neg liver ROS,   Endo/Other  negative endocrine ROS  Renal/GU negative Renal ROS     Musculoskeletal   Abdominal   Peds  Hematology negative hematology ROS (+)   Anesthesia Other Findings   Reproductive/Obstetrics                            Anesthesia Physical Anesthesia Plan  ASA: II  Anesthesia Plan: General   Post-op Pain Management:    Induction: Intravenous  Airway Management Planned: Oral ETT  Additional Equipment:   Intra-op Plan:   Post-operative Plan: Extubation in OR  Informed Consent: I have reviewed the patients History and Physical, chart, labs and discussed the procedure including the risks, benefits and alternatives for the proposed anesthesia with the patient or authorized representative who has indicated his/her understanding and acceptance.   Dental advisory given  Plan Discussed with: CRNA and Anesthesiologist  Anesthesia Plan Comments:        Anesthesia Quick Evaluation

## 2015-08-27 NOTE — Anesthesia Postprocedure Evaluation (Signed)
  Anesthesia Post-op Note  Patient: Tasha Valencia  Procedure(s) Performed: Procedure(s) with comments: LAPAROSCOPIC TUBAL LIGATION (Bilateral) - FILSHIE CLIPS INTRAUTERINE DEVICE (IUD) REMOVAL (N/A)  Patient Location: PACU  Anesthesia Type:General  Level of Consciousness: awake  Airway and Oxygen Therapy: Patient Spontanous Breathing  Post-op Pain: mild  Post-op Assessment: Post-op Vital signs reviewed              Post-op Vital Signs: Reviewed  Last Vitals:  Filed Vitals:   08/27/15 1027  BP: 147/79  Pulse: 76  Temp: 37.1 C  Resp: 16    Complications: No apparent anesthesia complications

## 2015-08-27 NOTE — Discharge Instructions (Signed)

## 2015-08-27 NOTE — Discharge Summary (Signed)
Physician Discharge Summary  Patient ID: Tasha Valencia MRN: [id number] DOB/AGE: 02-Oct-1971 44 y.o.  Admit date: 08/27/2015 Discharge date: 08/27/2015  Admission Diagnoses: desired permanent sterilization and removal of IUD  Discharge Diagnoses:  Active Problems:   * No active hospital problems. *   Discharged Condition: good  Hospital Course: Patient taken to OR for same day surgery   Consults: None  Significant Diagnostic Studies: none  Treatments: IV hydration and surgery: Diagnostic laparoscopy BTL, IUD removal  Discharge Exam: Blood pressure 147/79, pulse 76, temperature 98.8 F (37.1 C), temperature source Oral, resp. rate 16, height 5\' 4"  (1.626 m), weight 102.513 kg (226 lb), SpO2 100 %.   Disposition: Final discharge disposition not confirmed  Discharge Instructions     Remove dressing in 72 hours    Complete by:  As directed      Call MD for:  difficulty breathing, headache or visual disturbances    Complete by:  As directed      Call MD for:  hives    Complete by:  As directed      Call MD for:  persistant dizziness or light-headedness    Complete by:  As directed      Call MD for:  persistant nausea and vomiting    Complete by:  As directed      Call MD for:  redness, tenderness, or signs of infection (pain, swelling, redness, odor or green/yellow discharge around incision site)    Complete by:  As directed      Call MD for:  severe uncontrolled pain    Complete by:  As directed      Call MD for:  temperature >100.4    Complete by:  As directed      Diet - low sodium heart healthy    Complete by:  As directed      Discharge instructions    Complete by:  As directed   Call for your post operative visit, or if you have any questions or concerns [phone]     Increase activity slowly    Complete by:  As directed      Lifting restrictions    Complete by:  As directed   Don't lift anything heavier than 30 lbs            Medication List    TAKE  these medications        ibuprofen 800 MG tablet  Commonly known as:  ADVIL,MOTRIN  Take 1 tablet (800 mg total) by mouth every 8 (eight) hours as needed for cramping.     ONE DAILY MULTIVITAMIN WOMEN Tabs  Take 1 tablet by mouth daily.     oxyCODONE-acetaminophen 5-325 MG per tablet  Commonly known as:  ROXICET  Take 1-2 tablets by mouth every 4 (four) hours as needed for severe pain.         SignedCaffie Damme 08/27/2015, 2:19 PM

## 2015-08-27 NOTE — H&P (Signed)
Tasha Valencia is an 44 y.o. female presents for BTL. She has no complaints  Pertinent Gynecological History: Menses: flow is light, usually lasting less than 6 days, with minimal cramping and usually amenorrheic on IUD Bleeding: amenorrheic Contraception: IUD DES exposure: denies Blood transfusions: none Sexually transmitted diseases: no past history Previous GYN Procedures: c-section  Last mammogram: normal Date: 11/2014 Last pap: normal Date: 11/2014 OB History: G1, P1   Menstrual History: Menarche age: 62  No LMP recorded. Patient is not currently having periods (Reason: IUD).    History reviewed. No pertinent past medical history.  Past Surgical History  Procedure Laterality Date  . Cesarean section  1991  . Cholecystectomy  2002  . Intrauterine device (iud) insertion  2008    History reviewed. No pertinent family history.  Social History:  reports that she has never smoked. She has never used smokeless tobacco. She reports that she does not drink alcohol or use illicit drugs.  Allergies: No Known Allergies  Prescriptions prior to admission  Medication Sig Dispense Refill Last Dose  . Multiple Vitamins-Minerals (ONE DAILY MULTIVITAMIN WOMEN) TABS Take 1 tablet by mouth daily.   Past Month at Unknown time    Review of Systems  Constitutional: Negative for fever and chills.  HENT: Negative for hearing loss.   Eyes: Negative for blurred vision and double vision.  Respiratory: Negative for cough and hemoptysis.   Cardiovascular: Negative for chest pain and palpitations.  Gastrointestinal: Negative for heartburn and nausea.  Genitourinary: Negative for dysuria and urgency.  Skin: Negative for rash.  Neurological: Negative for dizziness, tingling and headaches.  Endo/Heme/Allergies: Negative for environmental allergies. Does not bruise/bleed easily.  Psychiatric/Behavioral: Negative for depression and suicidal ideas.  All other systems reviewed and are  negative.   Blood pressure 147/79, pulse 76, temperature 98.8 F (37.1 C), temperature source Oral, resp. rate 16, height 5\' 4"  (1.626 m), weight 102.513 kg (226 lb), SpO2 100 %. Physical Exam  Nursing note and vitals reviewed. Constitutional: She is oriented to person, place, and time. She appears well-developed and well-nourished.  HENT:  Head: Normocephalic.  Eyes: Pupils are equal, round, and reactive to light.  Cardiovascular: Normal rate.   Respiratory: Effort normal and breath sounds normal.  GI: Soft.  Genitourinary: Vagina normal and uterus normal.  Musculoskeletal: Normal range of motion.  Neurological: She is alert and oriented to person, place, and time. She has normal reflexes.  Psychiatric: She has a normal mood and affect.    Results for orders placed or performed during the hospital encounter of 08/27/15 (from the past 24 hour(s))  Pregnancy, urine POC     Status: None   Collection Time: 08/27/15 10:34 AM  Result Value Ref Range   Preg Test, Ur NEGATIVE NEGATIVE  Hemoglobin-hemacue, POC     Status: None   Collection Time: 08/27/15 10:58 AM  Result Value Ref Range   Hemoglobin 12.9 12.0 - 15.0 g/dL    No results found.  Assessment/Plan: 44 yo P1 who desires permanent sterilization   Pt counseled on all options of birth control including OCP's, Ortho Evra patch, Nuvaring, IUD's, Depo Provera, NEXPLANON, barrier methods, vasectomy, and Essure with hysteroscopy, all of which are safer options than laparoscopic tubal ligation. She understands the failure rate of BTL is up to 1%, with a high risk of ectopic (up to 50%) pregnancy should she conceive. She understands the procedure is permanent and irreversible. The risks of the procedure were discussed to include bleeding, transfusion, DVT/PE, infection,  injury to internal organs requiring additional surgery, hernia formation, and wound breakdown. The risk of tubal regret was also discussed. She demonstrates understanding  of these risks. All her questions were answered and she desires to proceed with laparoscopic BTL.   To OR L-scope BTL with Filshie clips and removal of IUD  Doree Barthel, [place] 08/27/2015, 12:13 PM

## 2015-08-27 NOTE — Anesthesia Procedure Notes (Signed)
Procedure Name: Intubation Date/Time: 08/27/2015 1:03 PM Performed by: Mechele Claude Pre-anesthesia Checklist: Patient identified, Emergency Drugs available, Suction available and Patient being monitored Patient Re-evaluated:Patient Re-evaluated prior to inductionOxygen Delivery Method: Circle System Utilized Preoxygenation: Pre-oxygenation with 100% oxygen Intubation Type: IV induction Ventilation: Mask ventilation without difficulty Laryngoscope Size: Mac and 3 Grade View: Grade I Tube type: Oral Tube size: 7.0 mm Number of attempts: 1 Airway Equipment and Method: Stylet Placement Confirmation: ETT inserted through vocal cords under direct vision,  positive ETCO2 and breath sounds checked- equal and bilateral Secured at: 20 cm Tube secured with: Tape Dental Injury: Teeth and Oropharynx as per pre-operative assessment

## 2015-08-28 ENCOUNTER — Encounter (HOSPITAL_BASED_OUTPATIENT_CLINIC_OR_DEPARTMENT_OTHER): Payer: Self-pay | Admitting: Obstetrics & Gynecology

## 2015-12-02 ENCOUNTER — Other Ambulatory Visit: Payer: Self-pay

## 2015-12-02 DIAGNOSIS — Z1231 Encounter for screening mammogram for malignant neoplasm of breast: Secondary | ICD-10-CM

## 2015-12-23 ENCOUNTER — Ambulatory Visit: Payer: 59

## 2016-01-05 ENCOUNTER — Ambulatory Visit
Admission: RE | Admit: 2016-01-05 | Discharge: 2016-01-05 | Disposition: A | Discharge status: Home / Self Care | Payer: Managed Care, Other (non HMO) | Source: Ambulatory Visit

## 2016-01-05 DIAGNOSIS — Z1231 Encounter for screening mammogram for malignant neoplasm of breast: Secondary | ICD-10-CM

## 2016-06-22 ENCOUNTER — Other Ambulatory Visit: Payer: Self-pay | Admitting: Obstetrics & Gynecology

## 2016-06-25 LAB — CYTOLOGY - PAP

## 2016-07-07 ENCOUNTER — Other Ambulatory Visit: Payer: Self-pay | Admitting: Obstetrics & Gynecology

## 2016-07-08 LAB — CYTOLOGY - PAP

## 2017-01-07 ENCOUNTER — Other Ambulatory Visit: Payer: Self-pay | Admitting: Obstetrics & Gynecology

## 2017-01-07 DIAGNOSIS — Z1231 Encounter for screening mammogram for malignant neoplasm of breast: Secondary | ICD-10-CM

## 2017-01-21 ENCOUNTER — Ambulatory Visit
Admission: RE | Admit: 2017-01-21 | Discharge: 2017-01-21 | Disposition: A | Discharge status: Home / Self Care | Payer: 59 | Source: Ambulatory Visit | Attending: Obstetrics & Gynecology | Admitting: Obstetrics & Gynecology

## 2017-01-21 DIAGNOSIS — Z1231 Encounter for screening mammogram for malignant neoplasm of breast: Secondary | ICD-10-CM

## 2019-02-12 ENCOUNTER — Other Ambulatory Visit: Payer: Self-pay | Admitting: Obstetrics & Gynecology

## 2019-04-10 ENCOUNTER — Other Ambulatory Visit (HOSPITAL_COMMUNITY): Payer: 59

## 2019-04-17 ENCOUNTER — Inpatient Hospital Stay (HOSPITAL_COMMUNITY): Admission: RE | Admit: 2019-04-17 | Payer: 59 | Source: Home / Self Care | Admitting: Obstetrics & Gynecology

## 2019-04-17 ENCOUNTER — Encounter (HOSPITAL_COMMUNITY): Admission: RE | Payer: Self-pay | Source: Home / Self Care

## 2019-04-17 SURGERY — LAPAROSCOPY, DIAGNOSTIC
Anesthesia: General | Laterality: Bilateral

## 2019-05-16 ENCOUNTER — Other Ambulatory Visit: Payer: Self-pay | Admitting: Obstetrics & Gynecology

## 2019-05-31 ENCOUNTER — Other Ambulatory Visit: Payer: Self-pay

## 2019-05-31 ENCOUNTER — Encounter (HOSPITAL_COMMUNITY)
Admission: RE | Admit: 2019-05-31 | Discharge: 2019-05-31 | Disposition: A | Discharge status: Home / Self Care | Payer: 59 | Source: Ambulatory Visit | Attending: Obstetrics & Gynecology | Admitting: Obstetrics & Gynecology

## 2019-05-31 ENCOUNTER — Other Ambulatory Visit (HOSPITAL_COMMUNITY): Payer: 59

## 2019-05-31 ENCOUNTER — Encounter (HOSPITAL_COMMUNITY): Payer: Self-pay

## 2019-05-31 DIAGNOSIS — D259 Leiomyoma of uterus, unspecified: Secondary | ICD-10-CM | POA: Diagnosis present

## 2019-05-31 DIAGNOSIS — Z1159 Encounter for screening for other viral diseases: Secondary | ICD-10-CM | POA: Insufficient documentation

## 2019-05-31 DIAGNOSIS — Z01818 Encounter for other preprocedural examination: Secondary | ICD-10-CM | POA: Diagnosis present

## 2019-05-31 DIAGNOSIS — I10 Essential (primary) hypertension: Secondary | ICD-10-CM | POA: Insufficient documentation

## 2019-05-31 HISTORY — DX: Essential (primary) hypertension: I10

## 2019-05-31 HISTORY — DX: Gastro-esophageal reflux disease without esophagitis: K21.9

## 2019-05-31 LAB — CBC
HCT: 41.3 % (ref 36.0–46.0)
Hemoglobin: 13.5 g/dL (ref 12.0–15.0)
MCH: 28.4 pg (ref 26.0–34.0)
MCHC: 32.7 g/dL (ref 30.0–36.0)
MCV: 86.9 fL (ref 80.0–100.0)
Platelets: 386 10*3/uL (ref 150–400)
RBC: 4.75 MIL/uL (ref 3.87–5.11)
RDW: 13.4 % (ref 11.5–15.5)
WBC: 5.8 10*3/uL (ref 4.0–10.5)
nRBC: 0 % (ref 0.0–0.2)

## 2019-05-31 LAB — BASIC METABOLIC PANEL
Anion gap: 9 (ref 5–15)
BUN: 10 mg/dL (ref 6–20)
CO2: 28 mmol/L (ref 22–32)
Calcium: 9.3 mg/dL (ref 8.9–10.3)
Chloride: 100 mmol/L (ref 98–111)
Creatinine, Ser: 0.71 mg/dL (ref 0.44–1.00)
GFR calc Af Amer: >60 mL/min (ref >60)
GFR calc non Af Amer: >60 mL/min (ref >60)
Glucose, Bld: 86 mg/dL (ref 70–99)
Potassium: 3.2 mmol/L — ABNORMAL LOW (ref 3.5–5.1)
Sodium: 137 mmol/L (ref 135–145)

## 2019-05-31 LAB — ABO/RH: ABO/RH(D): O POS

## 2019-05-31 LAB — TYPE AND SCREEN
ABO/RH(D): O POS
Antibody Screen: NEGATIVE

## 2019-05-31 NOTE — Progress Notes (Signed)
PCP -   Cardiologist -   Chest x-ray -   EKG -   Stress Test -   ECHO -   Cardiac Cath -   AICD- PM- LOOP-  Sleep Study -  CPAP -   LABS-  ASA-  ERAS-  HA1C- Fasting Blood Sugar -  Checks Blood Sugar _____ times a day  Anesthesia-  Pt denies having chest pain, sob, or fever at this time. All instructions explained to the pt, with a verbal understanding of the material. Pt agrees to go over the instructions while at home for a better understanding. The opportunity to ask questions was provided.

## 2019-05-31 NOTE — Progress Notes (Signed)
PCP - Dr Belva Bertin [facility]  Cardiologist - no  Chest x-ray - no  EKG - DOS  Stress Test - no  ECHO - no  Cardiac Cath - no  Sleep Study - yes  CPAP - no  LABS-  ASA-no  ERAS-yes  HA1C-0 Fasting Blood Sugar - 0 Checks Blood Sugar ___0__ times a day  Anesthesia-  Pt denies having chest pain, sob, or fever at this time. All instructions explained to the pt, with a verbal understanding of the material. Pt agrees to go over the instructions while at home for a better understanding. The opportunity to ask questions was provided.  Patient denies that she nor her contacts has experienced any of the following: Cough Fever >100.4 Runny Nose Sore Throat Difficulty breathing/ shortness of breath Travel in past 14 days-no Patient will have CCOVID test Friday, June 5 .

## 2019-05-31 NOTE — Progress Notes (Deleted)
PCP -   Cardiologist -   Chest x-ray -   EKG -   Stress Test -   ECHO -   Cardiac Cath -   AICD- PM- LOOP-  Sleep Study -  CPAP -   LABS-  ASA-  ERAS-  HA1C- Fasting Blood Sugar -  Checks Blood Sugar _____ times a day  Anesthesia-  Pt denies having chest pain, sob, or fever at this time. All instructions explained to the pt, with a verbal understanding of the material. Pt agrees to go over the instructions while at home for a better understanding. The opportunity to ask questions was provided.

## 2019-05-31 NOTE — Pre-Procedure Instructions (Addendum)
Tasha Valencia  05/31/2019    Your procedure is scheduled on Monday, June 8.   Report to [facility], Main Entrance or Entrance "A" at 11:30 A.M.               Your surgery or procedure is scheduled for 1:30 PM   Call this number if you have problems the morning of surgery: [phone]  This is the number for the Pre- Surgical Desk.   Remember:  Do not  drink after midnight Sunday, June 7.           You may drink clear liquids until 10:30.  Clear liquids allowed are:    Water, Juice (non-citric and without pulp), Carbonated beverages, Clear Tea, Black Coffee only, Plain Jell-O only, Gatorade and Plain Popsicles only    Take these medicines the morning of surgery with A SIP OF WATER : none  Please complete your PRE-SURGERY ENSURE that was provided to you 3 hours prior to you surgery start time.  Please, if able, drink it in one setting. DO NOT SIP.   STOP taking Aspirin, Aspirin Products (Goody Powder, Excedrin Migraine), Ibuprofen (Advil), Naproxen (Aleve), Vitamins and Herbal Products (ie Fish Oil).              Special instructions:  - Preparing For Surgery  Before surgery, you can play an important role. Because skin is not sterile, your skin needs to be as free of germs as possible. You can reduce the number of germs on your skin by washing with CHG (chlorahexidine gluconate) Soap before surgery.  CHG is an antiseptic cleaner which kills germs and bonds with the skin to continue killing germs even after washing.    Oral Hygiene is also important to reduce your risk of infection.  Remember - BRUSH YOUR TEETH THE MORNING OF SURGERY WITH YOUR REGULAR TOOTHPASTE  Please do not use if you have an allergy to CHG or antibacterial soaps. If your skin becomes reddened/irritated stop using the CHG.  Do not shave (including legs and underarms) for at least 48 hours prior to first CHG shower. It is OK to shave your face.  Please follow these instructions  carefully.   1. Shower the NIGHT BEFORE SURGERY and the MORNING OF SURGERY with CHG.   2. If you chose to wash your hair, wash your hair first as usual with your normal shampoo.  3. After you shampoo, rinse your hair and body thoroughly to remove the shampoo.  4. Use CHG as you would any other liquid soap. You can apply CHG directly to the skin and wash gently with a scrungie or a clean washcloth.   5. Apply the CHG Soap to your body ONLY FROM THE NECK DOWN.  Do not use on open wounds or open sores. Avoid contact with your eyes, ears, mouth and genitals (private parts). Wash Face and genitals (private parts)  with your normal soap.  6. Wash thoroughly, paying special attention to the area where your surgery will be performed.  7. Thoroughly rinse your body with warm water from the neck down.  8. DO NOT shower/wash with your normal soap after using and rinsing off the CHG Soap.  9. Pat yourself dry with a CLEAN TOWEL.  10. Wear CLEAN PAJAMAS to bed the night before surgery, wear comfortable clothes the morning of surgery  11. Place CLEAN SHEETS on your bed the night of your first shower and DO NOT SLEEP WITH PETS.  Day of Surgery: Shower as Instructed  Above  Do not wear lotions, powders, or perfumes, or deodorant. Please wear clean clothes to the hospital/surgery center.   Remember to brush your teeth WITH YOUR REGULAR TOOTHPASTE.  Do not wear jewelry, make-up or nail polish.  Do not shave 48 hours prior to surgery.  Do not bring valuables to the hospital.  [facility] is not responsible for any belongings or valuables.  Contacts, dentures or bridgework may not be worn into surgery.  Leave your suitcase in the car.  After surgery it may be brought to your room.  For patients admitted to the hospital, discharge time will be determined by your treatment team.  Patients discharged the day of surgery will not be allowed to drive home.   Please read over the following fact sheets  that you were given.

## 2019-05-31 NOTE — Pre-Procedure Instructions (Signed)
Tasha Valencia  05/31/2019    Your procedure is scheduled on Monday, June 8.   Report to [facility], Main Entrance or Entrance "A" at 11:30 A.M.               Your surgery or procedure is scheduled for 1:30 AM   Call this number if you have problems the morning of surgery: [phone]  This is the number for the Pre- Surgical Desk.   Remember:  Do not  drink after midnight Sunday, June 7.           You may drink clear liquids until 10:30.  Clear liquids allowed are:    Water, Juice (non-citric and without pulp), Carbonated beverages, Clear Tea, Black Coffee only, Plain Jell-O only, Gatorade and Plain Popsicles only    Take these medicines the morning of surgery with A SIP OF WATER : none  STOP taking Aspirin, Aspirin Products (Goody Powder, Excedrin Migraine), Ibuprofen (Advil), Naproxen (Aleve), Vitamins and Herbal Products (ie Fish Oil).              Special instructions:  [place]- Preparing For Surgery  Before surgery, you can play an important role. Because skin is not sterile, your skin needs to be as free of germs as possible. You can reduce the number of germs on your skin by washing with CHG (chlorahexidine gluconate) Soap before surgery.  CHG is an antiseptic cleaner which kills germs and bonds with the skin to continue killing germs even after washing.    Oral Hygiene is also important to reduce your risk of infection.  Remember - BRUSH YOUR TEETH THE MORNING OF SURGERY WITH YOUR REGULAR TOOTHPASTE  Please do not use if you have an allergy to CHG or antibacterial soaps. If your skin becomes reddened/irritated stop using the CHG.  Do not shave (including legs and underarms) for at least 48 hours prior to first CHG shower. It is OK to shave your face.  Please follow these instructions carefully.   1. Shower the NIGHT BEFORE SURGERY and the MORNING OF SURGERY with CHG.   2. If you chose to wash your hair, wash your hair first as usual with your normal  shampoo.  3. After you shampoo, rinse your hair and body thoroughly to remove the shampoo.  4. Use CHG as you would any other liquid soap. You can apply CHG directly to the skin and wash gently with a scrungie or a clean washcloth.   5. Apply the CHG Soap to your body ONLY FROM THE NECK DOWN.  Do not use on open wounds or open sores. Avoid contact with your eyes, ears, mouth and genitals (private parts). Wash Face and genitals (private parts)  with your normal soap.  6. Wash thoroughly, paying special attention to the area where your surgery will be performed.  7. Thoroughly rinse your body with warm water from the neck down.  8. DO NOT shower/wash with your normal soap after using and rinsing off the CHG Soap.  9. Pat yourself dry with a CLEAN TOWEL.  10. Wear CLEAN PAJAMAS to bed the night before surgery, wear comfortable clothes the morning of surgery  11. Place CLEAN SHEETS on your bed the night of your first shower and DO NOT SLEEP WITH PETS.  Day of Surgery: Shower as Instructed  Above  Do not wear lotions, powders, or perfumes, or deodorant. Please wear clean clothes to the hospital/surgery center.   Remember to brush your  teeth WITH YOUR REGULAR TOOTHPASTE.  Do not wear jewelry, make-up or nail polish.  Do not shave 48 hours prior to surgery.  Do not bring valuables to the hospital.  [facility] is not responsible for any belongings or valuables.  Contacts, dentures or bridgework may not be worn into surgery.  Leave your suitcase in the car.  After surgery it may be brought to your room.  For patients admitted to the hospital, discharge time will be determined by your treatment team.  Patients discharged the day of surgery will not be allowed to drive home.   Please read over the following fact sheets that you were given.

## 2019-06-01 ENCOUNTER — Other Ambulatory Visit (HOSPITAL_COMMUNITY)
Admission: RE | Admit: 2019-06-01 | Discharge: 2019-06-01 | Disposition: A | Discharge status: Home / Self Care | Payer: 59 | Source: Ambulatory Visit | Attending: Obstetrics & Gynecology | Admitting: Obstetrics & Gynecology

## 2019-06-01 DIAGNOSIS — Z01818 Encounter for other preprocedural examination: Secondary | ICD-10-CM | POA: Diagnosis not present

## 2019-06-02 LAB — NOVEL CORONAVIRUS, NAA (HOSP ORDER, SEND-OUT TO REF LAB; TAT 18-24 HRS): SARS-CoV-2, NAA: NOT DETECTED

## 2019-06-03 NOTE — Anesthesia Preprocedure Evaluation (Addendum)
Anesthesia Evaluation  Patient identified by MRN, date of birth, ID band Patient awake    Reviewed: Allergy & Precautions, NPO status , Patient's Chart, lab work & pertinent test results  Airway Mallampati: II  TM Distance: >3 FB Neck ROM: Full    Dental no notable dental hx. (+) Teeth Intact   Pulmonary neg pulmonary ROS,    Pulmonary exam normal breath sounds clear to auscultation       Cardiovascular hypertension, Pt. on medications Normal cardiovascular exam Rhythm:Regular Rate:Normal     Neuro/Psych negative neurological ROS  negative psych ROS   GI/Hepatic Neg liver ROS, GERD  ,  Endo/Other    Renal/GU      Musculoskeletal   Abdominal   Peds  Hematology   Anesthesia Other Findings   Reproductive/Obstetrics                            Lab Results  Component Value Date   WBC 5.8 05/31/2019   HGB 13.5 05/31/2019   HCT 41.3 05/31/2019   MCV 86.9 05/31/2019   PLT 386 05/31/2019   Lab Results  Component Value Date   CREATININE 0.71 05/31/2019   BUN 10 05/31/2019   NA 137 05/31/2019   K 3.2 (L) 05/31/2019   CL 100 05/31/2019   CO2 28 05/31/2019     Anesthesia Physical Anesthesia Plan  ASA: III  Anesthesia Plan: General   Post-op Pain Management:    Induction: Intravenous, Cricoid pressure planned and Rapid sequence  PONV Risk Score and Plan: 4 or greater and Treatment may vary due to age or medical condition, Ondansetron, Dexamethasone and Scopolamine patch - Pre-op  Airway Management Planned: Oral ETT  Additional Equipment:   Intra-op Plan:   Post-operative Plan: Extubation in OR  Informed Consent: I have reviewed the patients History and Physical, chart, labs and discussed the procedure including the risks, benefits and alternatives for the proposed anesthesia with the patient or authorized representative who has indicated his/her understanding and acceptance.      Dental advisory given  Plan Discussed with:   Anesthesia Plan Comments: (GA w T&S available)       Anesthesia Quick Evaluation

## 2019-06-04 ENCOUNTER — Inpatient Hospital Stay (HOSPITAL_COMMUNITY)
Admission: RE | Admit: 2019-06-04 | Discharge: 2019-06-06 | DRG: 743 | Disposition: A | Discharge status: Home / Self Care | Payer: No Typology Code available for payment source | Attending: Obstetrics & Gynecology | Admitting: Obstetrics & Gynecology

## 2019-06-04 ENCOUNTER — Other Ambulatory Visit: Payer: Self-pay

## 2019-06-04 ENCOUNTER — Encounter (HOSPITAL_COMMUNITY)
Admission: RE | Disposition: A | Discharge status: Home / Self Care | Payer: Self-pay | Source: Home / Self Care | Attending: Obstetrics & Gynecology

## 2019-06-04 ENCOUNTER — Encounter (HOSPITAL_COMMUNITY): Payer: Self-pay

## 2019-06-04 ENCOUNTER — Inpatient Hospital Stay (HOSPITAL_COMMUNITY): Payer: No Typology Code available for payment source | Admitting: Anesthesiology

## 2019-06-04 DIAGNOSIS — Z8249 Family history of ischemic heart disease and other diseases of the circulatory system: Secondary | ICD-10-CM

## 2019-06-04 DIAGNOSIS — Z9049 Acquired absence of other specified parts of digestive tract: Secondary | ICD-10-CM

## 2019-06-04 DIAGNOSIS — D649 Anemia, unspecified: Secondary | ICD-10-CM | POA: Diagnosis present

## 2019-06-04 DIAGNOSIS — D259 Leiomyoma of uterus, unspecified: Principal | ICD-10-CM | POA: Diagnosis present

## 2019-06-04 DIAGNOSIS — I10 Essential (primary) hypertension: Secondary | ICD-10-CM | POA: Diagnosis present

## 2019-06-04 DIAGNOSIS — K219 Gastro-esophageal reflux disease without esophagitis: Secondary | ICD-10-CM | POA: Diagnosis present

## 2019-06-04 DIAGNOSIS — G43909 Migraine, unspecified, not intractable, without status migrainosus: Secondary | ICD-10-CM | POA: Diagnosis present

## 2019-06-04 DIAGNOSIS — Z803 Family history of malignant neoplasm of breast: Secondary | ICD-10-CM | POA: Diagnosis not present

## 2019-06-04 HISTORY — PX: SUPRACERVICAL ABDOMINAL HYSTERECTOMY: SHX5393

## 2019-06-04 HISTORY — DX: Benign neoplasm of connective and other soft tissue, unspecified: D21.9

## 2019-06-04 HISTORY — PX: ABDOMINAL HYSTERECTOMY: SHX81

## 2019-06-04 LAB — POCT PREGNANCY, URINE: Preg Test, Ur: NEGATIVE

## 2019-06-04 SURGERY — HYSTERECTOMY, SUPRACERVICAL, ABDOMINAL
Anesthesia: General | Site: Abdomen | Laterality: Bilateral

## 2019-06-04 MED ORDER — NALOXONE HCL 0.4 MG/ML IJ SOLN: 0.4000 mg | INTRAMUSCULAR | Status: DC | PRN

## 2019-06-04 MED ORDER — EPHEDRINE 5 MG/ML INJ
INTRAVENOUS | Status: AC
  Filled 2019-06-04: qty 10

## 2019-06-04 MED ORDER — IBUPROFEN 600 MG PO TABS
600.0000 mg | ORAL_TABLET | Freq: Four times a day (QID) | ORAL | Status: DC
  Administered 2019-06-05 – 2019-06-06 (×2): 600 mg via ORAL
  Filled 2019-06-04 (×3): qty 1

## 2019-06-04 MED ORDER — BUPIVACAINE LIPOSOME 1.3 % IJ SUSP
20.0000 mL | INTRAMUSCULAR | Status: AC
  Administered 2019-06-04: 20 mL
  Filled 2019-06-04: qty 20

## 2019-06-04 MED ORDER — MIDAZOLAM HCL 5 MG/5ML IJ SOLN
INTRAMUSCULAR | Status: DC | PRN
  Administered 2019-06-04: 2 mg via INTRAVENOUS

## 2019-06-04 MED ORDER — BUPIVACAINE HCL (PF) 0.5 % IJ SOLN
INTRAMUSCULAR | Status: AC
  Filled 2019-06-04: qty 30

## 2019-06-04 MED ORDER — BUPIVACAINE HCL (PF) 0.5 % IJ SOLN
INTRAMUSCULAR | Status: DC | PRN
  Administered 2019-06-04: 30 mL

## 2019-06-04 MED ORDER — ACETAMINOPHEN 10 MG/ML IV SOLN
INTRAVENOUS | Status: DC | PRN
  Administered 2019-06-04: 1000 mg via INTRAVENOUS

## 2019-06-04 MED ORDER — ONDANSETRON HCL 4 MG/2ML IJ SOLN: 4.0000 mg | Freq: Once | INTRAMUSCULAR | Status: DC | PRN

## 2019-06-04 MED ORDER — PHENYLEPHRINE HCL-NACL 10-0.9 MG/250ML-% IV SOLN
INTRAVENOUS | Status: AC
  Filled 2019-06-04: qty 500

## 2019-06-04 MED ORDER — LACTATED RINGERS IV SOLN
INTRAVENOUS | Status: DC
  Administered 2019-06-04 (×2): via INTRAVENOUS

## 2019-06-04 MED ORDER — SODIUM CHLORIDE (PF) 0.9 % IJ SOLN
INTRAMUSCULAR | Status: AC
  Filled 2019-06-04: qty 50

## 2019-06-04 MED ORDER — DIPHENHYDRAMINE HCL 50 MG/ML IJ SOLN: 12.5000 mg | Freq: Four times a day (QID) | INTRAMUSCULAR | Status: DC | PRN

## 2019-06-04 MED ORDER — MIDAZOLAM HCL 2 MG/2ML IJ SOLN
INTRAMUSCULAR | Status: AC
  Filled 2019-06-04: qty 2

## 2019-06-04 MED ORDER — ROCURONIUM BROMIDE 10 MG/ML (PF) SYRINGE
PREFILLED_SYRINGE | INTRAVENOUS | Status: AC
  Filled 2019-06-04: qty 10

## 2019-06-04 MED ORDER — ONDANSETRON HCL 4 MG/2ML IJ SOLN: 4.0000 mg | Freq: Four times a day (QID) | INTRAMUSCULAR | Status: DC | PRN

## 2019-06-04 MED ORDER — ACETAMINOPHEN 10 MG/ML IV SOLN
INTRAVENOUS | Status: AC
  Filled 2019-06-04: qty 100

## 2019-06-04 MED ORDER — KETOROLAC TROMETHAMINE 30 MG/ML IJ SOLN
30.0000 mg | Freq: Four times a day (QID) | INTRAMUSCULAR | Status: AC
  Administered 2019-06-04 – 2019-06-05 (×3): 30 mg via INTRAVENOUS
  Filled 2019-06-04 (×3): qty 1

## 2019-06-04 MED ORDER — MENTHOL 3 MG MT LOZG: 1.0000 | LOZENGE | OROMUCOSAL | Status: DC | PRN

## 2019-06-04 MED ORDER — ONDANSETRON HCL 4 MG/2ML IJ SOLN
INTRAMUSCULAR | Status: AC
  Filled 2019-06-04: qty 6

## 2019-06-04 MED ORDER — ONDANSETRON HCL 4 MG/2ML IJ SOLN
INTRAMUSCULAR | Status: DC | PRN
  Administered 2019-06-04: 4 mg via INTRAVENOUS

## 2019-06-04 MED ORDER — SUGAMMADEX SODIUM 200 MG/2ML IV SOLN
INTRAVENOUS | Status: DC | PRN
  Administered 2019-06-04 (×2): 100 mg via INTRAVENOUS

## 2019-06-04 MED ORDER — DIPHENHYDRAMINE HCL 50 MG/ML IJ SOLN
INTRAMUSCULAR | Status: DC | PRN
  Administered 2019-06-04: 12.5 mg via INTRAVENOUS

## 2019-06-04 MED ORDER — HYDROCHLOROTHIAZIDE 25 MG PO TABS
25.0000 mg | ORAL_TABLET | Freq: Every day | ORAL | Status: DC
  Administered 2019-06-05 – 2019-06-06 (×2): 25 mg via ORAL
  Filled 2019-06-04 (×2): qty 1

## 2019-06-04 MED ORDER — MEPERIDINE HCL 25 MG/ML IJ SOLN: 6.2500 mg | INTRAMUSCULAR | Status: DC | PRN

## 2019-06-04 MED ORDER — DIPHENHYDRAMINE HCL 50 MG/ML IJ SOLN
INTRAMUSCULAR | Status: AC
  Filled 2019-06-04: qty 1

## 2019-06-04 MED ORDER — KETOROLAC TROMETHAMINE 30 MG/ML IJ SOLN
INTRAMUSCULAR | Status: DC | PRN
  Administered 2019-06-04: 30 mg via INTRAVENOUS

## 2019-06-04 MED ORDER — LIDOCAINE 2% (20 MG/ML) 5 ML SYRINGE
INTRAMUSCULAR | Status: AC
  Filled 2019-06-04: qty 15

## 2019-06-04 MED ORDER — HYDROMORPHONE HCL 1 MG/ML IJ SOLN: 0.2500 mg | INTRAMUSCULAR | Status: DC | PRN

## 2019-06-04 MED ORDER — SODIUM CHLORIDE (PF) 0.9 % IJ SOLN
INTRAMUSCULAR | Status: AC
  Filled 2019-06-04: qty 10

## 2019-06-04 MED ORDER — ACETAMINOPHEN 10 MG/ML IV SOLN: 1000.0000 mg | Freq: Once | INTRAVENOUS | Status: DC | PRN

## 2019-06-04 MED ORDER — HYDROCODONE-ACETAMINOPHEN 7.5-325 MG PO TABS: 1.0000 | ORAL_TABLET | Freq: Once | ORAL | Status: DC | PRN

## 2019-06-04 MED ORDER — SUCCINYLCHOLINE CHLORIDE 20 MG/ML IJ SOLN
INTRAMUSCULAR | Status: DC | PRN
  Administered 2019-06-04: 100 mg via INTRAVENOUS

## 2019-06-04 MED ORDER — VASOPRESSIN 20 UNIT/ML IV SOLN
INTRAVENOUS | Status: AC
  Filled 2019-06-04: qty 1

## 2019-06-04 MED ORDER — DEXAMETHASONE SODIUM PHOSPHATE 10 MG/ML IJ SOLN
INTRAMUSCULAR | Status: DC | PRN
  Administered 2019-06-04: 5 mg via INTRAVENOUS

## 2019-06-04 MED ORDER — KETOROLAC TROMETHAMINE 30 MG/ML IJ SOLN
INTRAMUSCULAR | Status: AC
  Filled 2019-06-04: qty 1

## 2019-06-04 MED ORDER — DIPHENHYDRAMINE HCL 12.5 MG/5ML PO ELIX: 12.5000 mg | ORAL_SOLUTION | Freq: Four times a day (QID) | ORAL | Status: DC | PRN

## 2019-06-04 MED ORDER — FENTANYL CITRATE (PF) 100 MCG/2ML IJ SOLN
INTRAMUSCULAR | Status: DC | PRN
  Administered 2019-06-04 (×2): 50 ug via INTRAVENOUS
  Administered 2019-06-04: 150 ug via INTRAVENOUS

## 2019-06-04 MED ORDER — FENTANYL CITRATE (PF) 250 MCG/5ML IJ SOLN
INTRAMUSCULAR | Status: AC
  Filled 2019-06-04: qty 5

## 2019-06-04 MED ORDER — OXYCODONE-ACETAMINOPHEN 5-325 MG PO TABS
1.0000 | ORAL_TABLET | Freq: Four times a day (QID) | ORAL | Status: DC | PRN
  Administered 2019-06-05 – 2019-06-06 (×2): 2 via ORAL
  Filled 2019-06-04 (×3): qty 2

## 2019-06-04 MED ORDER — PROPOFOL 10 MG/ML IV BOLUS
INTRAVENOUS | Status: DC | PRN
  Administered 2019-06-04: 170 mg via INTRAVENOUS

## 2019-06-04 MED ORDER — SODIUM CHLORIDE 0.9% FLUSH: 9.0000 mL | INTRAVENOUS | Status: DC | PRN

## 2019-06-04 MED ORDER — ONDANSETRON HCL 4 MG PO TABS: 4.0000 mg | ORAL_TABLET | Freq: Four times a day (QID) | ORAL | Status: DC | PRN

## 2019-06-04 MED ORDER — SIMETHICONE 80 MG PO CHEW
80.0000 mg | CHEWABLE_TABLET | Freq: Four times a day (QID) | ORAL | Status: DC | PRN
  Administered 2019-06-05: 80 mg via ORAL
  Filled 2019-06-04: qty 1

## 2019-06-04 MED ORDER — BUPIVACAINE HCL (PF) 0.25 % IJ SOLN
INTRAMUSCULAR | Status: AC
  Filled 2019-06-04: qty 30

## 2019-06-04 MED ORDER — DEXAMETHASONE SODIUM PHOSPHATE 10 MG/ML IJ SOLN
INTRAMUSCULAR | Status: AC
  Filled 2019-06-04: qty 3

## 2019-06-04 MED ORDER — LACTATED RINGERS IV SOLN: INTRAVENOUS | Status: DC

## 2019-06-04 MED ORDER — LIDOCAINE 2% (20 MG/ML) 5 ML SYRINGE
INTRAMUSCULAR | Status: DC | PRN
  Administered 2019-06-04: 100 mg via INTRAVENOUS

## 2019-06-04 MED ORDER — ESTRADIOL 0.1 MG/GM VA CREA
TOPICAL_CREAM | VAGINAL | Status: AC
  Filled 2019-06-04: qty 42.5

## 2019-06-04 MED ORDER — LIDOCAINE-EPINEPHRINE 1 %-1:100000 IJ SOLN
INTRAMUSCULAR | Status: AC
  Filled 2019-06-04: qty 1

## 2019-06-04 MED ORDER — CEFAZOLIN SODIUM-DEXTROSE 2-4 GM/100ML-% IV SOLN
INTRAVENOUS | Status: AC
  Filled 2019-06-04: qty 100

## 2019-06-04 MED ORDER — DOCUSATE SODIUM 100 MG PO CAPS
100.0000 mg | ORAL_CAPSULE | Freq: Two times a day (BID) | ORAL | Status: DC
  Administered 2019-06-04 – 2019-06-06 (×4): 100 mg via ORAL
  Filled 2019-06-04 (×4): qty 1

## 2019-06-04 MED ORDER — HYDROMORPHONE 1 MG/ML IV SOLN
INTRAVENOUS | Status: DC
  Administered 2019-06-04: 3.5 mg via INTRAVENOUS
  Administered 2019-06-04: 16:00:00 via INTRAVENOUS
  Administered 2019-06-05: 0.3 mg via INTRAVENOUS
  Administered 2019-06-05: 0.6 mg via INTRAVENOUS
  Filled 2019-06-04 (×2): qty 30

## 2019-06-04 MED ORDER — SUCCINYLCHOLINE CHLORIDE 200 MG/10ML IV SOSY
PREFILLED_SYRINGE | INTRAVENOUS | Status: AC
  Filled 2019-06-04: qty 10

## 2019-06-04 MED ORDER — ROCURONIUM BROMIDE 50 MG/5ML IV SOSY
PREFILLED_SYRINGE | INTRAVENOUS | Status: DC | PRN
  Administered 2019-06-04: 50 mg via INTRAVENOUS

## 2019-06-04 SURGICAL SUPPLY — 71 items
APPLICATOR ARISTA FLEXITIP XL (MISCELLANEOUS) IMPLANT
CANISTER SUCT 3000ML PPV (MISCELLANEOUS) ×3 IMPLANT
COVER BACK TABLE 60X90IN (DRAPES) ×1 IMPLANT
COVER LIGHT HANDLE  1/PK (MISCELLANEOUS) ×1
COVER LIGHT HANDLE 1/PK (MISCELLANEOUS) ×2 IMPLANT
COVER MAYO STAND STRL (DRAPES) ×3 IMPLANT
COVER WAND RF STERILE (DRAPES) ×3 IMPLANT
DECANTER SPIKE VIAL GLASS SM (MISCELLANEOUS) ×2 IMPLANT
DEFOGGER SCOPE WARMER CLEARIFY (MISCELLANEOUS) ×1 IMPLANT
DERMABOND ADVANCED (GAUZE/BANDAGES/DRESSINGS)
DERMABOND ADVANCED .7 DNX12 (GAUZE/BANDAGES/DRESSINGS) IMPLANT
DISSECTOR BLUNT TIP ENDO 5MM (MISCELLANEOUS) IMPLANT
DRAPE WARM FLUID 44X44 (DRAPES) IMPLANT
DRSG OPSITE POSTOP 3X4 (GAUZE/BANDAGES/DRESSINGS) ×3 IMPLANT
DRSG OPSITE POSTOP 4X10 (GAUZE/BANDAGES/DRESSINGS) ×3 IMPLANT
DURAPREP 26ML APPLICATOR (WOUND CARE) ×2 IMPLANT
ELECT REM PT RETURN 9FT ADLT (ELECTROSURGICAL) ×3
ELECTRODE REM PT RTRN 9FT ADLT (ELECTROSURGICAL) ×2 IMPLANT
FILTER SMOKE EVAC LAPAROSHD (FILTER) ×1 IMPLANT
GAUZE 4X4 16PLY RFD (DISPOSABLE) ×5 IMPLANT
GAUZE PACKING 2X5 YD STRL (GAUZE/BANDAGES/DRESSINGS) IMPLANT
GLOVE BIO SURGEON STRL SZ 6.5 (GLOVE) ×6 IMPLANT
GLOVE BIO SURGEON STRL SZ7 (GLOVE) ×9 IMPLANT
GLOVE BIOGEL PI IND STRL 7.0 (GLOVE) ×8 IMPLANT
GLOVE BIOGEL PI INDICATOR 7.0 (GLOVE) ×4
GOWN STRL REUS W/ TWL LRG LVL3 (GOWN DISPOSABLE) ×6 IMPLANT
GOWN STRL REUS W/TWL LRG LVL3 (GOWN DISPOSABLE) ×3
HEMOSTAT ARISTA ABSORB 3G PWDR (HEMOSTASIS) IMPLANT
HIBICLENS CHG 4% 4OZ BTL (MISCELLANEOUS) ×3 IMPLANT
KIT TURNOVER KIT B (KITS) ×6 IMPLANT
LIGASURE IMPACT 36 18CM CVD LR (INSTRUMENTS) ×4 IMPLANT
LIGASURE VESSEL 5MM BLUNT TIP (ELECTROSURGICAL) IMPLANT
NEEDLE HYPO 22GX1.5 SAFETY (NEEDLE) ×3 IMPLANT
NS IRRIG 1000ML POUR BTL (IV SOLUTION) ×3 IMPLANT
PACK ABDOMINAL GYN (CUSTOM PROCEDURE TRAY) ×3 IMPLANT
PACK LAVH (CUSTOM PROCEDURE TRAY) ×1 IMPLANT
PACK ROBOTIC GOWN (GOWN DISPOSABLE) ×1 IMPLANT
PACK TRENDGUARD 450 HYBRID PRO (MISCELLANEOUS) IMPLANT
PAD ARMBOARD 7.5X6 YLW CONV (MISCELLANEOUS) ×3 IMPLANT
PAD OB MATERNITY 4.3X12.25 (PERSONAL CARE ITEMS) ×3 IMPLANT
POUCH LAPAROSCOPIC INSTRUMENT (MISCELLANEOUS) ×1 IMPLANT
PROTECTOR NERVE ULNAR (MISCELLANEOUS) ×4 IMPLANT
RTRCTR C-SECT PINK 25CM LRG (MISCELLANEOUS) IMPLANT
SCISSORS LAP 5X35 DISP (ENDOMECHANICALS) IMPLANT
SET CYSTO W/LG BORE CLAMP LF (SET/KITS/TRAYS/PACK) ×1 IMPLANT
SET IRRIG TUBING LAPAROSCOPIC (IRRIGATION / IRRIGATOR) IMPLANT
SET TRI-LUMEN FLTR TB AIRSEAL (TUBING) ×3 IMPLANT
SET TUBE SMOKE EVAC HIGH FLOW (TUBING) ×1 IMPLANT
SPECIMEN JAR MEDIUM (MISCELLANEOUS) ×3 IMPLANT
SPONGE LAP 18X18 RF (DISPOSABLE) ×7 IMPLANT
SUT CHROMIC 2 0 CT 1 (SUTURE) ×2 IMPLANT
SUT MON AB 4-0 PS1 27 (SUTURE) ×1 IMPLANT
SUT PDS AB 0 CT 36 (SUTURE) ×4 IMPLANT
SUT PLAIN 2 0 (SUTURE)
SUT PLAIN 2 0 XLH (SUTURE) ×2 IMPLANT
SUT PLAIN ABS 2-0 54XMFL TIE (SUTURE) IMPLANT
SUT VIC AB 0 CT1 27 (SUTURE) ×9
SUT VIC AB 0 CT1 27XBRD ANBCTR (SUTURE) ×8 IMPLANT
SUT VIC AB 0 CT1 27XCR 8 STRN (SUTURE) ×10 IMPLANT
SUT VIC AB 2-0 CT1 (SUTURE) ×9 IMPLANT
SUT VIC AB 4-0 KS 27 (SUTURE) ×3 IMPLANT
SUT VICRYL 0 TIES 12 18 (SUTURE) ×6 IMPLANT
SUT VICRYL 0 UR6 27IN ABS (SUTURE) ×2 IMPLANT
SYR BULB IRRIGATION 50ML (SYRINGE) ×1 IMPLANT
SYR CONTROL 10ML LL (SYRINGE) ×3 IMPLANT
TOWEL GREEN STERILE FF (TOWEL DISPOSABLE) ×12 IMPLANT
TRAY FOLEY W/BAG SLVR 14FR (SET/KITS/TRAYS/PACK) ×6 IMPLANT
TRENDGUARD 450 HYBRID PRO PACK (MISCELLANEOUS)
TROCAR PORT AIRSEAL 5X120 (TROCAR) ×1 IMPLANT
TROCAR XCEL NON-BLD 11X100MML (ENDOMECHANICALS) ×1 IMPLANT
TROCAR XCEL NON-BLD 5MMX100MML (ENDOMECHANICALS) ×1 IMPLANT

## 2019-06-04 NOTE — Discharge Instructions (Signed)
Call [facility] @ [phone] if:  You have a temperature greater than or equal to 100.4 degrees Farenheit orally You have pain that is not made better by the pain medication given and taken as directed You have excessive bleeding or problems urinating  Take Colace (Docusate Sodium/Stool Softener) 100 mg 2-3 times daily while taking narcotic pain medicine to avoid constipation or until bowel movements are regular. Take, with food, Ibuprofen 600 mg every 6 hours for 5 days then as needed for pain  You may drive after 1 week You may walk up steps  You may shower  You may resume a regular diet  Keep incisions clean and dry, remove honeycomb dressing on June 11, 2019 Do not lift over 15 pounds for 6 weeks Avoid anything in vagina for 6 weeks (or until after your post-operative visit)

## 2019-06-04 NOTE — Op Note (Signed)
OPERATIVE NOTE   Tasha Valencia DOB: 12-Dec-1971  QAS:[id number]  Date of Surgery: 06/04/2019  Preoperative Diagnosis:  Uterine fibroids Bulk symptoms  Postoperative Diagnosis:  Same as above   Procedure:  1. Exploratory Laparotomy  2. Supracervical hysterectomy  3. Bilateral salpingectomy     Surgeon:  Mady Haagensen. Alwyn Pea, M.D.  Assistant:  Earnstine Regal, PA  Anesthetic:  General Endotracheal   Fluids: 1000 mL LR   UOP: 300 mL clear urine   EBL: 50 mL   Catheter: Foley during case   Complications: none.   Medications: Ancef 2GM IV   Findings: Normal uterus, with large pedunculated anterior / fundal fibroid. Normal bilateral ovaries, normal bilateral fallopian tubes (left tube with paratubal cyst).     Technique:  After adequate general anesthesia was achieved, the patient was prepped and draped in the usual sterile fashion after the foley had been placed. A Pfannenstiel incision was made with the scalpel along previous cesarean section scar and carried down to the fascia. The fascia was incised with the scalpel in a transverse manner and extended in a transverse curvilinear manner. The rectus muscles were split in the midline and a bowel free portion of the peritoneum was tented up. It was entered into with the hemostat and extended in a superior and inferior manner with good visualization of the bowel and bladder. The Alexis retractor was deployed and the bowel was packed away. The uterus tented up out of the abdominal cavity with a single toothed tenaculum on the fundus.  The pedunculated fibroid was cauterized off the uterus and the uterine bed made hemostatic with interrupted suture of 2-0 vicryl.   The right fallopian tube was excised from the fibriated end off the ovary using the Ligasure impact. The right round ligament was also transected using the Ligasure.  The bovie was then used to incise a transverse curvilinear incision in the vesico-cervical fascia. The ureters  were visualized bilaterally coursing well beneath the operative field. The utero-ovarian ligament was then entered with the bovie, it was cauterized and transected with the Ligasure.  Each pedicle was hemostatic. The same was performed on the contralateral side. The bladder flap was then retracted inferiorly with blunt and cautery dissection below the external cervical os. The uterine arteries were clamped bilaterally with heaney clamps and incised with the mayos. Each pedicle was then secured with a stitch of 0-vicryl.   The Uterine corpus was then incised off the cervix with the bovie with the fallopian tubes attached and handed off the field to be sent to Pathology. The endocervical canal was cauterized with the bovie. The peritoneum was sutured over the cervix in running, locked fashion with 0-vicryl. Hemostasis was insured and the ureters were peristalsing well out of the field of dissection. Irrigation was performed and hemostasis was assured.   The instruments and laps were removed from the abdominal cavity and the peritoneum with the muscle was closed with a running stitch of 2-0  chromic.The fascia was closed with 0-PDS in a running manner, starting from each end and meeting in the middle. The subcutaneous tissue was irrigated and perforating bleeders alleviated with the bovie. 50 mL of Experel diluted with marcaine was infiltrated into the wound. The subcutaneous layer was reapproximated with 2-0 plain gut.    The skin was closed with 3-0 vicryl sub-q with a keith needle. Dermabond was placed over the incision and covered with a honeycomb dressing.   Counts were correct x 3  Pt tolerated the procedure  well and was transferred to the recovery room in stable condition.   Sanjuana Kava MD

## 2019-06-04 NOTE — H&P (Signed)
Tasha Valencia is a 48 y.o. who presents for an abdominal supra-cervical hysterectomy because of bulk symptoms caused by her enlarged fibroid uterus.  For about a year the patient has reported an increase in abdominal girth and symptoms related to the same.  A pelvic ultrasound in November 2019 showed a uterus: ( 11.5 cm from fundus to external os): 9.69 x 6.29 x 5.36 cm, endometrium not well visualized, fundal intramural calcified fibroid: 3.01 cm; right ovary-2.81 cm and left ovary-2.25 cm.  On exam her uterus measures 18-20 weeks size. She denies any heavy bleeding, has regular monthly periods and has no pelvic pain. The patient was given both medical and surgical management options for her symptoms, however she has chosen to proceed with hysterectomy.   Past Medical History  OB History: G:1;  P: 1-0-0-1; C-section-1991  GYN History: menarche: 48 YO;      LMP: 05/03/2018    Contraception: Tubal Sterilization;  Denies history of abnormal PAP smear;   Last PAP smear-2019-normal with negative HPV  Medical History: Anemia and Migraine  Surgical History: 1991 C-section;  2002 Cholecystectomy and 2016 Tubal Sterilization   Family History: Breast Cancer and Hypertension  Social History: Single and employed as a Dentist;  Denies tobacco use and consumes alcohol on occasion   Medication: HCTZ 25 mg daily Multivitamin daily  No Known Allergies  ROS: Denies headache (except with migraine flare) , vision changes, nasal congestion, dysphagia, tinnitus, dizziness, hoarseness, cough,  chest pain, shortness of breath, nausea, vomiting, diarrhea,constipation,  urinary frequency,  dysuria, hematuria, vaginitis symptoms, pelvic pain, swelling of joints,easy bruising,  myalgias, arthralgias, skin rashes, unexplained weight loss and except as is mentioned in the history of present illness, patient's review of systems is otherwise negative.   Physical Exam  Bp: 120/78  Weight: 250 lbs.   Height 5' 3.5"  BMI: 43.6  Neck: supple without masses or thyromegaly Lungs: clear to auscultation Heart: regular rate and rhythm Abdomen: soft, non-tender and no organomegaly Pelvic:EGBUS- wnl; vagina-normal rugae; uterus-18-20 week size;  cervix without lesions or motion tenderness; adnexae-no tenderness or masses Extremities:  no clubbing, cyanosis or edema   Assesment: Large Symptomatic Fibroid Uterus   Disposition:  A discussion was held with patient regarding the indication for her procedure(s) along with the risks, which include but are not limited to: reaction to anesthesia, damage to adjacent organs, infection and excessive bleeding. Patient understands that she will need pap smears if she chooses to  retain her  cervix and that it may bleed on occasion even though the lining is cauterize.  She also understands that a fibroid may grow on the cervical stump. In the event a trachelectomy is needed in the future,  the patient was advised of the difficulty surrounding that procedure. Lastly the patient was advised that the lifetime  risk of ovarian cancer is 1:70 and the   benefits of ovarian conservation to include cardio and bone protection were reviewed.  In light of this the patient chooses to preserve her ovaries.  She verbalized understanding of these risks and has consented to proceed with an Abdominal Supra-cervical Hysterectomy at [place] on 06/04/2019.   CSN# [id number]   Willow Shidler J. Florene Glen, PA-C  for Dr.Walda S.Pinn

## 2019-06-04 NOTE — Anesthesia Procedure Notes (Signed)
Procedure Name: Intubation Date/Time: 06/04/2019 1:40 PM Performed by: Trinna Post., CRNA Pre-anesthesia Checklist: Patient identified, Emergency Drugs available, Suction available, Patient being monitored and Timeout performed Patient Re-evaluated:Patient Re-evaluated prior to induction Oxygen Delivery Method: Circle system utilized Preoxygenation: Pre-oxygenation with 100% oxygen Induction Type: IV induction, Rapid sequence and Cricoid Pressure applied Laryngoscope Size: Mac and 3 Grade View: Grade I Tube type: Oral Tube size: 7.0 mm Number of attempts: 1 Airway Equipment and Method: Stylet Placement Confirmation: ETT inserted through vocal cords under direct vision,  positive ETCO2 and breath sounds checked- equal and bilateral Secured at: 23 cm Tube secured with: Tape Dental Injury: Teeth and Oropharynx as per pre-operative assessment

## 2019-06-04 NOTE — Transfer of Care (Signed)
Immediate Anesthesia Transfer of Care Note  Patient: Tasha Valencia  Procedure(s) Performed: HYSTERECTOMY SUPRACERVICAL ABDOMINAL WITH BILATERAL SALPINGECTOMY (Bilateral Abdomen)  Patient Location: PACU  Anesthesia Type:General  Level of Consciousness: awake, alert  and oriented  Airway & Oxygen Therapy: Patient Spontanous Breathing  Post-op Assessment: Report given to RN and Post -op Vital signs reviewed and stable  Post vital signs: Reviewed and stable  Last Vitals:  Vitals Value Taken Time  BP 141/80 06/04/2019  4:35 PM  Temp    Pulse 66 06/04/2019  4:38 PM  Resp 18 06/04/2019  4:38 PM  SpO2 100 % 06/04/2019  4:38 PM  Vitals shown include unvalidated device data.  Last Pain:  Vitals:   06/04/19 1620  TempSrc:   PainSc: Asleep      Patients Stated Pain Goal: 0 (47/30/85 6943)  Complications: No apparent anesthesia complications

## 2019-06-04 NOTE — H&P (Signed)
Tasha Valencia is an 48 y.o. female with large fibroid uterus, bulk symptoms presents for supracervical hysterectomy  Pertinent Gynecological History: Menses is regular, no abnormal bleeding No h/o abnormal pap smears Mammogram up to date BIRADs 1   Menstrual History: Menarche age: 55 No LMP recorded.    Past Medical History:  Diagnosis Date  . GERD (gastroesophageal reflux disease)   . Hypertension     Past Surgical History:  Procedure Laterality Date  . CESAREAN SECTION  1991  . CHOLECYSTECTOMY  2002  . COLONOSCOPY    . INTRAUTERINE DEVICE (IUD) INSERTION  2008  . IUD REMOVAL N/A 08/27/2015   Procedure: INTRAUTERINE DEVICE (IUD) REMOVAL;  Surgeon: Sanjuana Kava, MD;  Location: [facility];  Service: Gynecology;  Laterality: N/A;  . LAPAROSCOPIC TUBAL LIGATION Bilateral 08/27/2015   Procedure: LAPAROSCOPIC TUBAL LIGATION;  Surgeon: Sanjuana Kava, MD;  Location: [facility];  Service: Gynecology;  Laterality: Bilateral;  FILSHIE CLIPS    History reviewed. No pertinent family history.  Social History:  reports that she has never smoked. She has never used smokeless tobacco. She reports that she does not drink alcohol or use drugs.  Allergies: No Known Allergies  Medications Prior to Admission  Medication Sig Dispense Refill Last Dose  . hydrochlorothiazide (HYDRODIURIL) 25 MG tablet Take 25 mg by mouth daily.   06/04/2019 at [facility]  . Multiple Vitamin (MULTIVITAMIN WITH MINERALS) TABS tablet Take 1 tablet by mouth daily.   06/04/2019 at 0715    ROS  Blood pressure 131/80, pulse 71, temperature 98.7 F (37.1 C), temperature source Oral, resp. rate 18, height 5\' 3"  (1.6 m), weight 112.9 kg, SpO2 99 %. Physical Exam Deferred to OR  Results for orders placed or performed during the hospital encounter of 06/04/19 (from the past 24 hour(s))  Pregnancy, urine POC     Status: None   Collection Time: 06/04/19 12:32 PM  Result Value Ref Range   Preg Test, Ur  NEGATIVE NEGATIVE    No results found.  Assessment/Plan: 48 year old with large fibroid uterus Patient desires to keep her cervix.  At pre operative visit in office discussed the R/B/A/I for surgery.  She voiced understanding and agrees to proceed On call to OR for [facility] bilateral salpingectomy with ovarian conservation   [name], [place] 06/04/2019, 1:26 PM

## 2019-06-05 ENCOUNTER — Encounter (HOSPITAL_COMMUNITY): Payer: Self-pay | Admitting: Obstetrics & Gynecology

## 2019-06-05 LAB — CBC
HCT: 37.5 % (ref 36.0–46.0)
Hemoglobin: 12.5 g/dL (ref 12.0–15.0)
MCH: 28.3 pg (ref 26.0–34.0)
MCHC: 33.3 g/dL (ref 30.0–36.0)
MCV: 85 fL (ref 80.0–100.0)
Platelets: 389 10*3/uL (ref 150–400)
RBC: 4.41 MIL/uL (ref 3.87–5.11)
RDW: 13.2 % (ref 11.5–15.5)
WBC: 9.8 10*3/uL (ref 4.0–10.5)
nRBC: 0 % (ref 0.0–0.2)

## 2019-06-05 NOTE — Progress Notes (Signed)
FRANSHESCA CHIPMAN is a10 y.o.  [id number]  Post Op Date # 1: Abdominal Supra-cervical Hysterectomy/Bilateral Salpingectomy  Subjective: Patient is Doing well postoperatively. Patient has Pain is controlled with current analgesics. Medications being used: prescription NSAID's including Ketorolac 30 mg IV and narcotic analgesics including PCA Hydromorphone. Ambulating in the halls without difficulty, tolerating full liquids, but hasn't voided since Foley removed 30 minutes ago.    Objective: Vital signs in last 24 hours: Temp:  [97.3 F (36.3 C)-98.7 F (37.1 C)] 98.2 F (36.8 C) (06/09 0456) Pulse Rate:  [61-74] 61 (06/09 0456) Resp:  [15-22] 16 (06/09 0456) BP: (126-150)/(77-89) 139/83 (06/09 0456) SpO2:  [95 %-100 %] 98 % (06/09 0456) Weight:  [112.9 kg] 112.9 kg (06/08 1144)  Intake/Output from previous day: 06/08 0701 - 06/09 0700 In: 3185 [P.O.:730; I.V.:2455] Out: 2050 [Urine:2000] Intake/Output this shift: No intake/output data recorded. Recent Labs  Lab 05/31/19 1451 06/05/19 0222  WBC 5.8 9.8  HGB 13.5 12.5  HCT 41.3 37.5  PLT 386 389     Recent Labs  Lab 05/31/19 1451  NA 137  K 3.2*  CL 100  CO2 28  BUN 10  CREATININE 0.71  CALCIUM 9.3  GLUCOSE 86    EXAM: General: alert, cooperative, no distress and sitting in recliner at bedside. Resp: clear to auscultation bilaterally Cardio: regular rate and rhythm, S1, S2 normal, no murmur, click, rub or gallop GI: Bowel sounds present in all 4 quadrants; dressing with a single 2 mm dry blood stain at left of dressing otherwise clean/dry/intact. Extremities: Homans sign is negative, no sign of DVT and no calf tenderness. Vaginal Bleeding: none   Assessment: s/p Procedure(s): HYSTERECTOMY SUPRACERVICAL ABDOMINAL WITH BILATERAL SALPINGECTOMY: stable and progressing well  Plan: Advance diet Encourage ambulation Advance to PO medication Routine care.  LOS: 1 day    Earnstine Regal, PA-C 06/05/2019 7:11  AM

## 2019-06-05 NOTE — Anesthesia Postprocedure Evaluation (Signed)
Anesthesia Post Note  Patient: Rossie Muskrat  Procedure(s) Performed: HYSTERECTOMY SUPRACERVICAL ABDOMINAL WITH BILATERAL SALPINGECTOMY (Bilateral Abdomen)     Patient location during evaluation: PACU Anesthesia Type: General Level of consciousness: awake and alert Pain management: pain level controlled Vital Signs Assessment: post-procedure vital signs reviewed and stable Respiratory status: spontaneous breathing, nonlabored ventilation and respiratory function stable Cardiovascular status: blood pressure returned to baseline and stable Postop Assessment: no apparent nausea or vomiting Anesthetic complications: no    Last Vitals:  Vitals:   06/05/19 1042 06/05/19 1324  BP: 121/79 114/63  Pulse: 66 (!) 56  Resp: 18 17  Temp:  37 C  SpO2: 99% 100%    Last Pain:  Vitals:   06/05/19 1324  TempSrc: Oral  PainSc:    Pain Goal: Patients Stated Pain Goal: 2 (06/04/19 2000)                 Lynda Rainwater

## 2019-06-05 NOTE — Plan of Care (Signed)

## 2019-06-06 MED ORDER — GLYCERIN (LAXATIVE) 2.1 G RE SUPP
1.0000 | RECTAL | Status: DC | PRN
  Filled 2019-06-06: qty 1

## 2019-06-06 MED ORDER — DOCUSATE SODIUM 100 MG PO CAPS: 100.0000 mg | ORAL_CAPSULE | Freq: Two times a day (BID) | ORAL | 3 refills | Status: DC | PRN

## 2019-06-06 MED ORDER — OXYCODONE-ACETAMINOPHEN 5-325 MG PO TABS: ORAL_TABLET | ORAL | 0 refills | Status: DC

## 2019-06-06 MED ORDER — CYCLOBENZAPRINE HCL 10 MG PO TABS: 10.0000 mg | ORAL_TABLET | Freq: Three times a day (TID) | ORAL | 0 refills | Status: DC | PRN

## 2019-06-06 MED ORDER — IBUPROFEN 600 MG PO TABS: 600.0000 mg | ORAL_TABLET | Freq: Four times a day (QID) | ORAL | 3 refills | Status: DC

## 2019-06-06 MED ORDER — IBUPROFEN 600 MG PO TABS: ORAL_TABLET | ORAL | 1 refills | Status: DC

## 2019-06-06 MED ORDER — OXYCODONE-ACETAMINOPHEN 5-325 MG PO TABS: 1.0000 | ORAL_TABLET | ORAL | 0 refills | Status: DC | PRN

## 2019-06-06 NOTE — Discharge Summary (Signed)
Physician Discharge Summary  Patient ID: Tasha Valencia MRN: [id number] DOB/AGE: 03-27-1971 48 y.o.  Admit date: 06/04/2019 Discharge date: 06/06/2019   Discharge Diagnoses: Symptomatic Fibroid Uterus Active Problems:   Leiomyoma of uterus, unspecified   Uterine fibroid   Operation: Abdominal Supra-cervical Hysterectomy with Bilateral Salpingectomy    Discharged Condition: Good  Hospital Course: On the date of admission the patient underwent the aforementioned procedures and tolerated them well.  Post operative course was unremarkable with the patient resuming bowel and bladder function by post operative day #2 and was therefore deemed ready for discharge home.  Discharge hemoglobin was 12.5.  Disposition: Home-Self Care  Discharge Medications:  Allergies as of 06/06/2019   No Known Allergies     Medication List    TAKE these medications   hydrochlorothiazide 25 MG tablet Commonly known as:  HYDRODIURIL Take 25 mg by mouth daily.   ibuprofen 600 MG tablet Commonly known as:  ADVIL Take 1 tablet with food every 6 hours for 5 days then prn- pain   multivitamin with minerals Tabs tablet Take 1 tablet by mouth daily.   oxyCODONE-acetaminophen 5-325 MG tablet Commonly known as:  Percocet Take 1-2 tablets every 6 hours as needed for post operative pain          Follow-up: Dr. Sanjuana Kava on June 11, 2019 at 1:45 p.m.   Signed: Earnstine Regal, PA-C 06/06/2019, 7:22 AM

## 2019-06-06 NOTE — Progress Notes (Signed)
Pt discharged home in stable condition after going over discharge teaching with no concerns voiced 

## 2019-06-06 NOTE — Progress Notes (Signed)
Tasha Valencia is a20 y.o.  [id number]  Post Op Date # 2: Abdominal Supra-cervical Hysterectomy/Bilateral Salpingectomy    Subjective: Patient is Doing well postoperatively. Patient has Pain is controlled with current analgesics. Medications being used: prescription NSAID's including Ibuprofen 600 mg and narcotic analgesics including Percocet 5/325. Tolerating a regular diet, ambulating in the halls and voiding without difficulty.   Objective: Vital signs in last 24 hours: Temp:  [97.9 F (36.6 C)-98.6 F (37 C)] 97.9 F (36.6 C) (06/10 0529) Pulse Rate:  [56-66] 60 (06/10 0529) Resp:  [17-19] 18 (06/10 0529) BP: (111-121)/(63-79) 112/70 (06/10 0529) SpO2:  [99 %-100 %] 99 % (06/10 0529)  Intake/Output from previous day: 06/09 0701 - 06/10 0700 In: 840 [P.O.:840] Out: -  Intake/Output this shift: No intake/output data recorded. Recent Labs  Lab 05/31/19 1451 06/05/19 0222  WBC 5.8 9.8  HGB 13.5 12.5  HCT 41.3 37.5  PLT 386 389     Recent Labs  Lab 05/31/19 1451  NA 137  K 3.2*  CL 100  CO2 28  BUN 10  CREATININE 0.71  CALCIUM 9.3  GLUCOSE 86    EXAM: General: alert, cooperative, no distress and Sitting on the side of bed, just returned from bathroom. Resp: clear to auscultation bilaterally Cardio: regular rate and rhythm, S1, S2 normal, no murmur, click, rub or gallop GI: Bowel sounds present in all 4 quadrants; dressing dry/intact with dried stain along incison line. Extremities: Homans sign is negative, no sign of DVT and no calf tenderness.   Assessment: s/p Procedure(s): HYSTERECTOMY SUPRACERVICAL ABDOMINAL WITH BILATERAL SALPINGECTOMY: stable, progressing well and tolerating diet  Plan: Discharge home  LOS: 2 days    Earnstine Regal, PA-C 06/06/2019 7:13 AM

## 2019-06-06 NOTE — Progress Notes (Signed)
2 Days Post-Op Procedure(s) (LRB): HYSTERECTOMY SUPRACERVICAL ABDOMINAL WITH BILATERAL SALPINGECTOMY (Bilateral)  Subjective: Patient reports incisional pain, tolerating PO, + flatus and no problems voiding.    Objective: I have reviewed patient's vital signs, intake and output and medications.  General: alert, cooperative and no distress GI: soft, non-tender; bowel sounds normal; no masses,  no organomegaly and incision: clean, dry and intact Extremities: extremities normal, atraumatic, no cyanosis or edema and Homans sign is negative, no sign of DVT Vaginal Bleeding: none  Assessment: s/p Procedure(s): HYSTERECTOMY SUPRACERVICAL ABDOMINAL WITH BILATERAL SALPINGECTOMY (Bilateral): progressing well  Plan: Discontinue IV fluids Discharge home  LOS: 2 days    Severo Beber, [place] 06/06/2019, 11:54 AM

## 2019-10-31 ENCOUNTER — Other Ambulatory Visit: Payer: Self-pay

## 2019-10-31 ENCOUNTER — Encounter: Payer: Self-pay | Admitting: Family Medicine

## 2019-10-31 ENCOUNTER — Telehealth (INDEPENDENT_AMBULATORY_CARE_PROVIDER_SITE_OTHER): Payer: 59 | Admitting: Family Medicine

## 2019-10-31 DIAGNOSIS — R7303 Prediabetes: Secondary | ICD-10-CM

## 2019-10-31 DIAGNOSIS — E785 Hyperlipidemia, unspecified: Secondary | ICD-10-CM

## 2019-10-31 NOTE — Patient Instructions (Signed)
° ° ° °  If you have lab work done today you will be contacted with your lab results within the next 2 weeks.  If you have not heard from us then please contact us. The fastest way to get your results is to register for My Chart. ° ° °IF you received an x-ray today, you will receive an invoice from [facility]. Please contact [facility] at [phone] with questions or concerns regarding your invoice.  ° °IF you received labwork today, you will receive an invoice from [facility]. Please contact [facility] at [phone] with questions or concerns regarding your invoice.  ° °Our billing staff will not be able to assist you with questions regarding bills from these companies. ° °You will be contacted with the lab results as soon as they are available. The fastest way to get your results is to activate your My Chart account. Instructions are located on the last page of this paperwork. If you have not heard from us regarding the results in 2 weeks, please contact this office. °  ° ° ° °

## 2019-10-31 NOTE — Progress Notes (Signed)
VIDEO Encounter- SOAP NOTE NEWPatient  This telephone encounter was conducted with the patient's (or proxy's) verbal consent via VIDEO : yes/no: Yes Patient was instructed to have this encounter in a suitably private space; and to only have persons present to whom they give permission to participate. In addition, patient identity was confirmed by use of name plus two identifiers (DOB and address).  I discussed the limitations, risks, security and privacy concerns of performing an evaluation and management service by video and the availability of in person appointments. I also discussed with the patient that there may be a patient responsible charge related to this service. The patient expressed understanding and agreed to proceed.  I spent a total of TIME; 0 MIN TO 60 MIN: 30 minutes talking with the patient or their proxy.  CC: Obesity  Subjective   Tasha Valencia is a 48 y.o. established patient. Telephone visit today for  HPI  She has been trying to lose weight and has fluctation in her weight and has never lost more than 15 pounds She is losing for some weight loss help She walks and  She reports that she tried keto diet which did not work She tried [employer] watchers a couple years ago On her own she tries to limit eating out at restaurants and avoid fried foods. She struggles with her vegetable intake and drinking enough water. She does not drink alcohol She snacks in the middle of the night on ice cream sometimes.  She had a hysterectomy without oophorectomy.  Her goal was to lose 50 pounds in 6-8 months.  Lab Results  Component Value Date   HGBA1C 5.6 10/15/2013          Patient Active Problem List   Diagnosis Date Noted  . Uterine fibroid 06/04/2019  . Leiomyoma of uterus, unspecified 05/31/2019  . Routine general medical examination at a health care facility 06/01/2012  . Contraceptive device, intrauterine 06/01/2012  . Prediabetes 02/07/2012  .  Dyslipidemia 02/07/2012  . Elevated BP 02/07/2012  . Vitamin D deficiency 09/16/2011  . MORBID OBESITY 10/09/2008    Past Medical History:  Diagnosis Date  . Fibroids    UTERINE  . GERD (gastroesophageal reflux disease)   . Hypertension     Current Outpatient Medications  Medication Sig Dispense Refill  . Bacillus Coagulans-Inulin (ALIGN PREBIOTIC-PROBIOTIC PO) Take by mouth.    Lyndle Herrlich SULFATE PO Take by mouth.    . Multiple Vitamin (MULTIVITAMIN WITH MINERALS) TABS tablet Take 1 tablet by mouth daily.    . hydrochlorothiazide (HYDRODIURIL) 25 MG tablet Take 25 mg by mouth daily.     No current facility-administered medications for this visit.     No Known Allergies  Social History   Socioeconomic History  . Marital status: Single    Spouse name: Not on file  . Number of children: Not on file  . Years of education: Not on file  . Highest education level: Not on file  Occupational History  . Not on file  Social Needs  . Financial resource strain: Not on file  . Food insecurity    Worry: Not on file    Inability: Not on file  . Transportation needs    Medical: Not on file    Non-medical: Not on file  Tobacco Use  . Smoking status: Never Smoker  . Smokeless tobacco: Never Used  Substance and Sexual Activity  . Alcohol use: No  . Drug use: No  . Sexual activity:  Not on file  Lifestyle  . Physical activity    Days per week: Not on file    Minutes per session: Not on file  . Stress: Not on file  Relationships  . Social Herbalist on phone: Not on file    Gets together: Not on file    Attends religious service: Not on file    Active member of club or organization: Not on file    Attends meetings of clubs or organizations: Not on file    Relationship status: Not on file  . Intimate partner violence    Fear of current or ex partner: Not on file    Emotionally abused: Not on file    Physically abused: Not on file    Forced sexual activity: Not on  file  Other Topics Concern  . Not on file  Social History Narrative  . Not on file    ROS Review of Systems  Constitutional: Negative for activity change, appetite change, chills and fever.  HENT: Negative for congestion, nosebleeds, trouble swallowing and voice change.   Respiratory: Negative for cough, shortness of breath and wheezing.   Gastrointestinal: Negative for diarrhea, nausea and vomiting.  Genitourinary: Negative for difficulty urinating, dysuria, flank pain and hematuria.  Musculoskeletal: Negative for back pain, joint swelling and neck pain.  Neurological: Negative for dizziness, speech difficulty, light-headedness and numbness.  See HPI. All other review of systems negative.   Objective   Vitals as reported by the patient: Today's Vitals   10/31/19 0751  Weight: 239 lb (108.4 kg)  Height: 5\' 4"  (1.626 m)    Diagnoses and all orders for this visit:  MORBID OBESITY  Prediabetes  Dyslipidemia   Spent 30 minutes counseling the patient about nutrition and exercise. Discussed keeping a food diary and also making small changes.  She should walk in place for 10 minutes twice a day.  She should also remember to plan her meals and also eat more vegetables.  Her 3 goals were sent as a Therapist, music.  Also reviewed previous labs and diagnosis also reviewed care everywhere.    I discussed the assessment and treatment plan with the patient. The patient was provided an opportunity to ask questions and all were answered. The patient agreed with the plan and demonstrated an understanding of the instructions.   The patient was advised to call back or seek an in-person evaluation if the symptoms worsen or if the condition fails to improve as anticipated.  I provided 30 minutes of face-to-face time DOXY.[email or web address] during this encounter.  Forrest Moron, MD  Primary Care at [facility]

## 2019-10-31 NOTE — Progress Notes (Signed)
~  Establish care  ~Weight loss info ~Right leg numbness and burning 2X a week, can last about an hour or two.  Going on a few months now.

## 2019-11-01 NOTE — Progress Notes (Signed)
Pt scheduled  

## 2019-11-07 ENCOUNTER — Other Ambulatory Visit: Payer: Self-pay

## 2019-11-07 ENCOUNTER — Ambulatory Visit: Payer: 59

## 2019-11-07 DIAGNOSIS — R7303 Prediabetes: Secondary | ICD-10-CM

## 2019-11-07 DIAGNOSIS — E785 Hyperlipidemia, unspecified: Secondary | ICD-10-CM

## 2019-11-08 LAB — LIPID PANEL
Chol/HDL Ratio: 3.6 ratio (ref 0.0–4.4)
Cholesterol, Total: 159 mg/dL (ref 100–199)
HDL: 44 mg/dL (ref >39)
LDL Chol Calc (NIH): 98 mg/dL (ref 0–99)
Triglycerides: 88 mg/dL (ref 0–149)
VLDL Cholesterol Cal: 17 mg/dL (ref 5–40)

## 2019-11-08 LAB — CMP14+EGFR
ALT: 20 IU/L (ref 0–32)
AST: 15 IU/L (ref 0–40)
Albumin/Globulin Ratio: 0.9 — ABNORMAL LOW (ref 1.2–2.2)
Albumin: 3.7 g/dL — ABNORMAL LOW (ref 3.8–4.8)
Alkaline Phosphatase: 66 IU/L (ref 39–117)
BUN/Creatinine Ratio: 11 (ref 9–23)
BUN: 8 mg/dL (ref 6–24)
Bilirubin Total: 0.4 mg/dL (ref 0.0–1.2)
CO2: 23 mmol/L (ref 20–29)
Calcium: 9.2 mg/dL (ref 8.7–10.2)
Chloride: 104 mmol/L (ref 96–106)
Creatinine, Ser: 0.75 mg/dL (ref 0.57–1.00)
GFR calc Af Amer: 108 mL/min/{1.73_m2} (ref >59)
GFR calc non Af Amer: 94 mL/min/{1.73_m2} (ref >59)
Globulin, Total: 3.9 g/dL (ref 1.5–4.5)
Glucose: 91 mg/dL (ref 65–99)
Potassium: 4.2 mmol/L (ref 3.5–5.2)
Sodium: 139 mmol/L (ref 134–144)
Total Protein: 7.6 g/dL (ref 6.0–8.5)

## 2019-11-08 LAB — HEMOGLOBIN A1C
Est. average glucose Bld gHb Est-mCnc: 108 mg/dL
Hgb A1c MFr Bld: 5.4 % (ref 4.8–5.6)

## 2019-11-08 LAB — TSH: TSH: 0.907 u[IU]/mL (ref 0.450–4.500)

## 2019-11-28 ENCOUNTER — Ambulatory Visit: Payer: Self-pay | Admitting: Family Medicine

## 2019-12-12 ENCOUNTER — Ambulatory Visit (INDEPENDENT_AMBULATORY_CARE_PROVIDER_SITE_OTHER): Payer: 59 | Admitting: Family Medicine

## 2019-12-12 ENCOUNTER — Encounter: Payer: Self-pay | Admitting: Family Medicine

## 2019-12-12 ENCOUNTER — Other Ambulatory Visit: Payer: Self-pay

## 2019-12-12 NOTE — Progress Notes (Signed)
Established Patient Office Visit  Subjective:  Patient ID: Tasha Valencia, female    DOB: Apr 09, 1971  Age: 48 y.o. MRN: [id number]  CC:  Chief Complaint  Patient presents with  . Weight Loss    4 week f/u and review labs    HPI Tasha Valencia presents for   Weight loss management She is using the [employer] from her job and it tracks her movement with her watch She did not bring it with her today.  She has not started meal prep She states that she is eating better than she was before but not getting in the vegetables. She was drinking the smoothies from [employer]   Wt Readings from Last 3 Encounters:  12/12/19 246 lb 3.2 oz (111.7 kg)  10/31/19 239 lb (108.4 kg)  06/04/19 248 lb 14.4 oz (112.9 kg)     Wt Readings from Last 3 Encounters:  12/12/19 246 lb 3.2 oz (111.7 kg)  10/31/19 239 lb (108.4 kg)  06/04/19 248 lb 14.4 oz (112.9 kg)     Past Medical History:  Diagnosis Date  . Fibroids    UTERINE  . GERD (gastroesophageal reflux disease)   . Hypertension     Past Surgical History:  Procedure Laterality Date  . ABDOMINAL HYSTERECTOMY  06/04/2019  . CESAREAN SECTION  1991  . CHOLECYSTECTOMY  2002  . COLONOSCOPY    . INTRAUTERINE DEVICE (IUD) INSERTION  2008  . IUD REMOVAL N/A 08/27/2015   Procedure: INTRAUTERINE DEVICE (IUD) REMOVAL;  Surgeon: Sanjuana Kava, MD;  Location: [place];  Service: Gynecology;  Laterality: N/A;  . LAPAROSCOPIC TUBAL LIGATION Bilateral 08/27/2015   Procedure: LAPAROSCOPIC TUBAL LIGATION;  Surgeon: Sanjuana Kava, MD;  Location: [place];  Service: Gynecology;  Laterality: Bilateral;  FILSHIE CLIPS  . SUPRACERVICAL ABDOMINAL HYSTERECTOMY Bilateral 06/04/2019   Procedure: HYSTERECTOMY SUPRACERVICAL ABDOMINAL WITH BILATERAL SALPINGECTOMY;  Surgeon: Sanjuana Kava, MD;  Location: [place];  Service: Gynecology;  Laterality: Bilateral;    No family history on file.  Social History   Socioeconomic  History  . Marital status: Single    Spouse name: Not on file  . Number of children: Not on file  . Years of education: Not on file  . Highest education level: Not on file  Occupational History  . Not on file  Tobacco Use  . Smoking status: Never Smoker  . Smokeless tobacco: Never Used  Substance and Sexual Activity  . Alcohol use: No  . Drug use: No  . Sexual activity: Not on file  Other Topics Concern  . Not on file  Social History Narrative  . Not on file   Social Determinants of Health   Financial Resource Strain:   . Difficulty of Paying Living Expenses: Not on file  Food Insecurity:   . Worried About Charity fundraiser in the Last Year: Not on file  . Ran Out of Food in the Last Year: Not on file  Transportation Needs:   . Lack of Transportation (Medical): Not on file  . Lack of Transportation (Non-Medical): Not on file  Physical Activity:   . Days of Exercise per Week: Not on file  . Minutes of Exercise per Session: Not on file  Stress:   . Feeling of Stress : Not on file  Social Connections:   . Frequency of Communication with Friends and Family: Not on file  . Frequency of Social Gatherings with Friends and Family: Not on file  .  Attends Religious Services: Not on file  . Active Member of Clubs or Organizations: Not on file  . Attends Archivist Meetings: Not on file  . Marital Status: Not on file  Intimate Partner Violence:   . Fear of Current or Ex-Partner: Not on file  . Emotionally Abused: Not on file  . Physically Abused: Not on file  . Sexually Abused: Not on file    Outpatient Medications Prior to Visit  Medication Sig Dispense Refill  . FERROUS SULFATE PO Take by mouth.    . Multiple Vitamin (MULTIVITAMIN WITH MINERALS) TABS tablet Take 1 tablet by mouth daily.    . Bacillus Coagulans-Inulin (ALIGN PREBIOTIC-PROBIOTIC PO) Take by mouth.    . hydrochlorothiazide (HYDRODIURIL) 25 MG tablet Take 25 mg by mouth daily.     No  facility-administered medications prior to visit.    No Known Allergies  ROS Review of Systems Review of Systems  Constitutional: Negative for activity change, appetite change, chills and fever.  HENT: Negative for congestion, nosebleeds, trouble swallowing and voice change.   Respiratory: Negative for cough, shortness of breath and wheezing.   Gastrointestinal: Negative for diarrhea, nausea and vomiting.  Genitourinary: Negative for difficulty urinating, dysuria, flank pain and hematuria.  Musculoskeletal: Negative for back pain, joint swelling and neck pain.  Neurological: Negative for dizziness, speech difficulty, light-headedness and numbness.  See HPI. All other review of systems negative.     Objective:    Physical Exam  BP (!) 142/96 (BP Location: Left Arm, Patient Position: Sitting)   Pulse 75   Temp 97.6 F (36.4 C) (Oral)   Resp 17   Ht 5\' 4"  (1.626 m)   Wt 246 lb 3.2 oz (111.7 kg)   LMP 05/04/2019   SpO2 96%   BMI 42.26 kg/m  Wt Readings from Last 3 Encounters:  12/12/19 246 lb 3.2 oz (111.7 kg)  10/31/19 239 lb (108.4 kg)  06/04/19 248 lb 14.4 oz (112.9 kg)   Physical Exam  Constitutional: Oriented to person, place, and time. Appears well-developed and well-nourished.  HENT:  Head: Normocephalic and atraumatic.  Eyes: Conjunctivae and EOM are normal.  Cardiovascular: Normal rate, regular rhythm, normal heart sounds and intact distal pulses.  No murmur heard. Pulmonary/Chest: Effort normal and breath sounds normal. No stridor. No respiratory distress. Has no wheezes.  Neurological: Is alert and oriented to person, place, and time.  Skin: Skin is warm. Capillary refill takes less than 2 seconds.  Psychiatric: Has a normal mood and affect. Behavior is normal. Judgment and thought content normal.    Health Maintenance Due  Topic Date Due  . HIV Screening  05/24/1985  . PAP SMEAR-Modifier  07/08/2019    There are no preventive care reminders to display  for this patient.  Lab Results  Component Value Date   TSH 0.907 11/07/2019   Lab Results  Component Value Date   WBC 9.8 06/05/2019   HGB 12.5 06/05/2019   HCT 37.5 06/05/2019   MCV 85.0 06/05/2019   PLT 389 06/05/2019   Lab Results  Component Value Date   NA 139 11/07/2019   K 4.2 11/07/2019   CO2 23 11/07/2019   GLUCOSE 91 11/07/2019   BUN 8 11/07/2019   CREATININE 0.75 11/07/2019   BILITOT 0.4 11/07/2019   ALKPHOS 66 11/07/2019   AST 15 11/07/2019   ALT 20 11/07/2019   PROT 7.6 11/07/2019   ALBUMIN 3.7 (L) 11/07/2019   CALCIUM 9.2 11/07/2019   ANIONGAP 9 05/31/2019  Lab Results  Component Value Date   CHOL 159 11/07/2019   Lab Results  Component Value Date   HDL 44 11/07/2019   Lab Results  Component Value Date   LDLCALC 98 11/07/2019   Lab Results  Component Value Date   TRIG 88 11/07/2019   Lab Results  Component Value Date   CHOLHDL 3.6 11/07/2019   Lab Results  Component Value Date   HGBA1C 5.4 11/07/2019      Assessment & Plan:   Problem List Items Addressed This Visit      Other   MORBID OBESITY - Primary     Discussed how commercially made smoothies can actual hinder weight loss Talked about meal prep Discussed exercise for even 10 minutes a day Reviewed educational materials such as glycemic index  No orders of the defined types were placed in this encounter.   Follow-up: No follow-ups on file.   A total of 25 minutes were spent face-to-face with the patient during this encounter and over half of that time was spent on counseling and coordination of care.  Forrest Moron, MD

## 2019-12-12 NOTE — Patient Instructions (Addendum)
 Low Glycemic Foods (20-49)  Breakfast Cereals: All-Bran                All-Bran Fruit ' n Oats Fiber One               Oatmeal (not instant)  Oat bran Fruits and fruit juices: (Limit to 1-2 servings per day) Apples               Apricots (fresh & dried)  Blackberries            Blueberries Cherries                  Cranberries             Peaches                  Pears                       Plums                       Prunes Grapefruit                Raspberries            Strawberries           Tangerine Apple juice             Grapefruit juice Tomato juice  Beans and legumes (fresh-cooked): Black-eyed peas     Butter beans Chick peas              Lentils     Green beans           Lima beans               Kidney beans          Navy beans  Non-starchy vegetables: Asparagus, bok choy, broccoli, cabbage, cauliflower, celery, cucumber, greens, lettuce, mushrooms, peppers, tomatoes, okra, onions, snow peas, spinach, summer squash  Grains: Barley                                Bulgur Rye                                    Wild rice  Nuts and oils : Almonds         Peanuts     Sunflower seeds  Hazelnuts      Pecans          Walnuts Oils that are liquid at room temperature  Dairy, fish, and meat: Milk, skim                         Lowfat cheese Yogurt, lowfat, fruit sugar sweetened Lean red meat                      Fish  Skinless chicken & turkey    Shellfish Moderate Glycemic Foods (50-69)  Breakfast Cereals: Bran Buds                             Bran Chex Just Right                              Mini-Wheats Special K         Swiss muesli  Fruits: Banana (under-ripe)             Dates Figs                                      Grapes Kiwi                                      Mango Oranges                               Raisins  Fruit Juices: Cranberry juice                    Orange juice  Beans and legumes: Boston-type baked beans Canned pinto, kidney,  or navy beans Green peas  Vegetables: Beets                         Raw Carrots  Sweet potato              Yam Corn on the cob  Breads: Pita (pocket) bread          Oat bran bread Pumpernickel bread           Rye bread Wheat bread, high fiber       Grains: Cornmeal                           Rice, brown   Rice, white                         Couscous Pasta: Macaroni                           Pizza  cheese Raviolimeat filled           Spaghetti, white        Nuts: Cashews                           Macadamia  Snacks: Chocolate                    Ice cream,lowfat  Muffin                               Popcorn High Glycemic Foods (70-100)   Breakfast Cereals: Cheerios                 Corn Chex Corn Flakes            Cream of Wheat Grape Nuts              Grape Nut Flakes Life                 Nutri-Grain       Puffed Rice               Puffed Wheat Rice Chex                 Rice Krispies Shredded Wheat               Team Total Fruits: Pineapple                 Watermelon Banana (over-ripe)  Beverages: Sodas, sweet tea, pineapple juice  Vegetables: Potato, baked, boiled, fried, mashed French fries Canned or frozen corn Cooked carrots Parsnips Winter squash  Breads: Most breads (white and whole grain) Bagels                     Bread sticks Bread stuffing          Kaiser roll Dinner rolls  Grains: Rice, instant          Tapioca, with milk  Candy and most cookies Snacks: Donuts                      Corn chips        Jelly beans                 Pretzels Pastries                             Restaurant and ethnic foods Most Chinese food (sugar in stir fry or wok sauces) Teriyaki-style meats and vegetables     If you have lab work done today you will be contacted with your lab results within the next 2 weeks.  If you have not heard from us then please contact us. The fastest way to get your results is to register for My Chart.   IF you received an  x-ray today, you will receive an invoice from [facility]. Please contact [facility] at [phone] with questions or concerns regarding your invoice.   IF you received labwork today, you will receive an invoice from [facility]. Please contact [facility] at [phone] with questions or concerns regarding your invoice.   Our billing staff will not be able to assist you with questions regarding bills from these companies.  You will be contacted with the lab results as soon as they are available. The fastest way to get your results is to activate your My Chart account. Instructions are located on the last page of this paperwork. If you have not heard from us regarding the results in 2 weeks, please contact this office.     

## 2020-01-21 ENCOUNTER — Ambulatory Visit: Payer: 59 | Admitting: Family Medicine

## 2020-06-20 ENCOUNTER — Encounter: Payer: Self-pay | Admitting: Cardiovascular Disease

## 2020-06-20 ENCOUNTER — Ambulatory Visit (INDEPENDENT_AMBULATORY_CARE_PROVIDER_SITE_OTHER): Payer: No Typology Code available for payment source | Admitting: Cardiovascular Disease

## 2020-06-20 ENCOUNTER — Other Ambulatory Visit: Payer: Self-pay

## 2020-06-20 VITALS — BP 124/86 | HR 90 | Ht 63.0 in | Wt 260.0 lb

## 2020-06-20 DIAGNOSIS — I10 Essential (primary) hypertension: Secondary | ICD-10-CM | POA: Diagnosis not present

## 2020-06-20 DIAGNOSIS — Z79899 Other long term (current) drug therapy: Secondary | ICD-10-CM

## 2020-06-20 MED ORDER — CHLORTHALIDONE 25 MG PO TABS: 25.0000 mg | ORAL_TABLET | Freq: Every day | ORAL | 3 refills | Status: DC

## 2020-06-20 NOTE — Assessment & Plan Note (Signed)
History of morbid obesity with a BMI of 46.  She has gained 15 to 20 pounds in the last 6 months.  She does work at home.

## 2020-06-20 NOTE — Progress Notes (Signed)
06/20/2020 Tasha Valencia   04-Aug-1971  [id number]  Primary Physician Tasha Moron, MD Primary Cardiologist: [name] MD [name], [place]  HPI:  Tasha Valencia is a 49 y.o. 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  She was referred by her OB/GYN, Dr. Alwyn Pea, for evaluation and treatment of hypertension.  She basically has no cardiac risk factors.  She was on a medication remotely but stopped.  She was recently started on lisinopril hydrochlorothiazide with an excellent therapeutic effect however she did develop an ACE cough.  She denies chest pain or shortness of breath.   Current Meds  Medication Sig  . Multiple Vitamin (MULTIVITAMIN WITH MINERALS) TABS tablet Take 1 tablet by mouth daily.  . [DISCONTINUED] Bacillus Coagulans-Inulin (ALIGN PREBIOTIC-PROBIOTIC PO) Take by mouth.  . [DISCONTINUED] FERROUS SULFATE PO Take by mouth.  . [DISCONTINUED] hydrochlorothiazide (HYDRODIURIL) 25 MG tablet Take 25 mg by mouth daily.  . [DISCONTINUED] lisinopril-hydrochlorothiazide (ZESTORETIC) 10-12.5 MG tablet Take 1 tablet by mouth daily.     No Known Allergies  Social History   Socioeconomic History  . Marital status: Single    Spouse name: Not on file  . Number of children: Not on file  . Years of education: Not on file  . Highest education level: Not on file  Occupational History  . Not on file  Tobacco Use  . Smoking status: Never Smoker  . Smokeless tobacco: Never Used  Vaping Use  . Vaping Use: Never used  Substance and Sexual Activity  . Alcohol use: No  . Drug use: No  . Sexual activity: Not on file  Other Topics Concern  . Not on file  Social History Narrative  . Not on file   Social Determinants of Health   Financial Resource Strain:   . Difficulty of Paying Living Expenses:   Food Insecurity:   . Worried About Charity fundraiser  in the Last Year:   . Arboriculturist in the Last Year:   Transportation Needs:   . Film/video editor (Medical):   Marland Kitchen Lack of Transportation (Non-Medical):   Physical Activity:   . Days of Exercise per Week:   . Minutes of Exercise per Session:   Stress:   . Feeling of Stress :   Social Connections:   . Frequency of Communication with Friends and Family:   . Frequency of Social Gatherings with Friends and Family:   . Attends Religious Services:   . Active Member of Clubs or Organizations:   . Attends Archivist Meetings:   Marland Kitchen Marital Status:   Intimate Partner Violence:   . Fear of Current or Ex-Partner:   . Emotionally Abused:   Marland Kitchen Physically Abused:   . Sexually Abused:      Review of Systems: General: negative for chills, fever, night sweats or weight changes.  Cardiovascular: negative for chest pain, dyspnea on exertion, edema, orthopnea, palpitations, paroxysmal nocturnal dyspnea or shortness of breath Dermatological: negative for rash Respiratory: negative for cough or wheezing Urologic: negative for hematuria Abdominal: negative for nausea, vomiting, diarrhea, bright red blood per rectum, melena, or hematemesis Neurologic: negative for visual changes, syncope, or dizziness All other systems reviewed and are otherwise negative except as noted above.    Blood pressure 124/86, pulse 90, height 5\' 3"  (1.6 m), weight 260 lb (117.9 kg), last menstrual period 05/04/2019.  General appearance: alert and no distress Neck: no adenopathy, no carotid bruit, no JVD, supple, symmetrical, trachea midline and thyroid not enlarged, symmetric, no tenderness/mass/nodules Lungs: clear to auscultation bilaterally Heart: regular rate and rhythm, S1, S2 normal, no murmur, click, rub or gallop Extremities: extremities normal, atraumatic, no cyanosis or edema Pulses: 2+ and symmetric Skin: Skin color, texture, turgor normal. No rashes or lesions Neurologic: Alert and oriented X  3, normal strength and tone. Normal symmetric reflexes. Normal coordination and gait  EKG sinus rhythm at 90 without ST or T wave changes.  Personally reviewed this EKG.  ASSESSMENT AND PLAN:   MORBID OBESITY History of morbid obesity with a BMI of 46.  She has gained 15 to 20 pounds in the last 6 months.  She does work at home.  Elevated BP History of essential hypertension with blood pressure measured today at 124/86.  She was recently started on lisinopril hydrochlorothiazide as is experiencing an ACE cough.  She does avoid salt.  I am going to discontinue her lisinopril hydrochlorothiazide and begin her on chlorthalidone 25 mg a day.  We will check a basic metabolic panel in 7 to 10 days.  She will keep a blood pressure log for next 30 days and will return to see a Pharm.D. in 4 weeks to review and make appropriate medication changes at that time.      [name] MD FACP,FACC,FAHA, [facility] 06/20/2020 3:46 PM

## 2020-06-20 NOTE — Patient Instructions (Addendum)
Medication Instructions:  Stop Lisinopril-hydrochlorothiazide  Start- Chlorthalidone 25 mg daily  Check blood pressure daily for 30 day and keep a log.  *If you need a refill on your cardiac medications before your next appointment, please call your pharmacy*   Lab Work: Your physician recommends that you return for lab work in 1 week ( BMP)  If you have labs (blood work) drawn today and your tests are completely normal, you will receive your results only by: Marland Kitchen MyChart Message (if you have MyChart) OR . A paper copy in the mail If you have any lab test that is abnormal or we need to change your treatment, we will call you to review the results.   Testing/Procedures: None   Follow-Up: At [facility], you and your health needs are our priority.  As part of our continuing mission to provide you with exceptional heart care, we have created designated Provider Care Teams.  These Care Teams include your primary Cardiologist (physician) and Advanced Practice Providers (APPs -  Physician Assistants and Nurse Practitioners) who all work together to provide you with the care you need, when you need it.  We recommend signing up for the patient portal called "MyChart".  Sign up information is provided on this After Visit Summary.  MyChart is used to connect with patients for Virtual Visits (Telemedicine).  Patients are able to view lab/test results, encounter notes, upcoming appointments, etc.  Non-urgent messages can be sent to your provider as well.   To learn more about what you can do with MyChart, go to [email or web address].    Your next appointment:   3 month(s)  The format for your next appointment:   In Person  Provider:   Quay Burow, MD  Your physician recommends that you schedule a follow-up appointment in 4 weeks with Pharm D.

## 2020-06-20 NOTE — Assessment & Plan Note (Signed)
History of essential hypertension with blood pressure measured today at 124/86.  She was recently started on lisinopril hydrochlorothiazide as is experiencing an ACE cough.  She does avoid salt.  I am going to discontinue her lisinopril hydrochlorothiazide and begin her on chlorthalidone 25 mg a day.  We will check a basic metabolic panel in 7 to 10 days.  She will keep a blood pressure log for next 30 days and will return to see a Pharm.D. in 4 weeks to review and make appropriate medication changes at that time.

## 2020-06-27 LAB — BASIC METABOLIC PANEL
BUN/Creatinine Ratio: 13 (ref 9–23)
BUN: 10 mg/dL (ref 6–24)
CO2: 24 mmol/L (ref 20–29)
Calcium: 9.9 mg/dL (ref 8.7–10.2)
Chloride: 95 mmol/L — ABNORMAL LOW (ref 96–106)
Creatinine, Ser: 0.78 mg/dL (ref 0.57–1.00)
GFR calc Af Amer: 103 mL/min/{1.73_m2} (ref >59)
GFR calc non Af Amer: 89 mL/min/{1.73_m2} (ref >59)
Glucose: 94 mg/dL (ref 65–99)
Potassium: 3.4 mmol/L — ABNORMAL LOW (ref 3.5–5.2)
Sodium: 134 mmol/L (ref 134–144)

## 2020-07-01 ENCOUNTER — Telehealth: Payer: Self-pay

## 2020-07-01 DIAGNOSIS — E876 Hypokalemia: Secondary | ICD-10-CM

## 2020-07-01 MED ORDER — POTASSIUM CHLORIDE CRYS ER 20 MEQ PO TBCR: 20.0000 meq | EXTENDED_RELEASE_TABLET | Freq: Every day | ORAL | 3 refills | Status: DC

## 2020-07-01 NOTE — Telephone Encounter (Signed)
Spoke to patient recent lab results given.Advised potassium alittle low.Dr.Berry advised starting Kdur 20 meq daily.Repeat bmet in 2 weeks.

## 2020-07-15 ENCOUNTER — Other Ambulatory Visit: Payer: Self-pay | Admitting: *Deleted

## 2020-07-15 DIAGNOSIS — E876 Hypokalemia: Secondary | ICD-10-CM

## 2020-07-15 NOTE — Progress Notes (Signed)
tsh

## 2020-07-16 LAB — BASIC METABOLIC PANEL
BUN/Creatinine Ratio: 15 (ref 9–23)
BUN: 11 mg/dL (ref 6–24)
CO2: 26 mmol/L (ref 20–29)
Calcium: 9.3 mg/dL (ref 8.7–10.2)
Chloride: 96 mmol/L (ref 96–106)
Creatinine, Ser: 0.75 mg/dL (ref 0.57–1.00)
GFR calc Af Amer: 107 mL/min/{1.73_m2} (ref >59)
GFR calc non Af Amer: 93 mL/min/{1.73_m2} (ref >59)
Glucose: 85 mg/dL (ref 65–99)
Potassium: 3.6 mmol/L (ref 3.5–5.2)
Sodium: 136 mmol/L (ref 134–144)

## 2020-07-21 LAB — SPECIMEN STATUS REPORT

## 2020-07-21 LAB — TSH: TSH: 0.764 u[IU]/mL (ref 0.450–4.500)

## 2020-08-20 ENCOUNTER — Ambulatory Visit: Payer: No Typology Code available for payment source | Admitting: Cardiovascular Disease

## 2020-09-02 ENCOUNTER — Other Ambulatory Visit: Payer: Self-pay

## 2020-09-02 ENCOUNTER — Ambulatory Visit (INDEPENDENT_AMBULATORY_CARE_PROVIDER_SITE_OTHER): Payer: No Typology Code available for payment source | Admitting: Pharmacist

## 2020-09-02 VITALS — BP 126/78 | HR 88 | Resp 16 | Ht 64.0 in | Wt 248.8 lb

## 2020-09-02 DIAGNOSIS — I10 Essential (primary) hypertension: Secondary | ICD-10-CM | POA: Diagnosis not present

## 2020-09-02 NOTE — Progress Notes (Signed)
Patient ID: [name]                 DOB: 1971-03-15                      MRN: [id number]     HPI: Tasha Valencia is a 49 y.o. female referred by Dr. Gwenlyn Valencia to HTN clinic. PMH includes BMI of 42 and hypertension.  A1C at 5.4 and TSH at normal limits. Patient developed cough with lisinopril and Dr Tasha Valencia changed her therapy to chlorthalidone 25mg  daily during last OV on 06/20/2020. Follow up BMET shows stable renal function. She denies headaches, or chest pain and reports compliance.   Current HTN meds:  Chlorthalidone 25mg    Previously tried:  Lisinopril - cough  BP goal: <130/80  Social History: she stopped drinking coffee, denies alcohol or tobacco use  Diet: Patient avoid adding salt to her meals, most meals are home cooked except for take out 3-4 times per weeks  Exercise: 15-30 min walks per day   Home BP readings: none available - per patient systolic 371-062I, diastolic 94-85I  Wt Readings from Last 3 Encounters:  09/02/20 248 lb 12.8 oz (112.9 kg)  06/20/20 260 lb (117.9 kg)  12/12/19 246 lb 3.2 oz (111.7 kg)   BP Readings from Last 3 Encounters:  09/02/20 126/78  06/20/20 124/86  12/12/19 (!) 142/96   Pulse Readings from Last 3 Encounters:  09/02/20 88  06/20/20 90  12/12/19 75    Past Medical History:  Diagnosis Date  . Fibroids    UTERINE  . GERD (gastroesophageal reflux disease)   . Hypertension     Current Outpatient Medications on File Prior to Visit  Medication Sig Dispense Refill  . chlorthalidone (HYGROTON) 25 MG tablet Take 1 tablet (25 mg total) by mouth daily. 90 tablet 3  . Multiple Vitamin (MULTIVITAMIN WITH MINERALS) TABS tablet Take 1 tablet by mouth daily.    . phentermine 37.5 MG capsule Take 37.5 mg by mouth every morning.    . potassium chloride SA (KLOR-CON) 20 MEQ tablet Take 1 tablet (20 mEq total) by mouth daily. 90 tablet 3   No current facility-administered medications on file prior to visit.    No Known Allergies  Blood  pressure 126/78, pulse 88, resp. rate 16, height 5\' 4"  (1.626 m), weight 248 lb 12.8 oz (112.9 kg), last menstrual period 05/04/2019, SpO2 97 %.  Hypertension Blood pressure is at goal during office visit , but remains slightly above goal most of the time at home. Patient is tolerating chlorthalidone monotherapy and BMET shows stable renal function and electrolytes.   Patient has an adequate amount of physical therapy with 15-30 minutes daily walks and reports not adding salt to her food. Noted she still eating significant amount of take out like [place] food, pizza, and [place] food. We discussed need to avoid bacon and cold cuts of meats or use  low sodium alternatives. Also instructed to use low sodium salad dressing. Should avoid [place] food sauces and soy sauce or use low sodium alternatives as well. Hydration should be water and beverages without caffeine. Encouraged to avoid drinking Gatorade or electrolytes replacements.   Plan to follow up in 8 weeks to assess home BP readings and impact of decreased sodium in diet. Will add amlodipine 5mg  to her current regimen if additional BP control is needed at follow up.   Tasha Valencia PharmD, BCPS, [place] [address]  Tasha Valencia [id number] 09/03/2020 2:58 PM

## 2020-09-02 NOTE — Patient Instructions (Signed)
Return for a  follow up appointment in 8 weeks  Check your blood pressure at home daily (if able) and keep record of the readings.  Take your BP meds as follows: *NO MEDICATION CHANGES*  Bring all of your meds, your BP cuff and your record of home blood pressures to your next appointment.  Exercise as you're able, try to walk approximately 30 minutes per day.  Keep salt intake to a minimum, especially watch canned and prepared boxed foods.  Eat more fresh fruits and vegetables and fewer canned items.  Avoid eating in fast food restaurants.    HOW TO TAKE YOUR BLOOD PRESSURE: . Rest 5 minutes before taking your blood pressure. .  Don't smoke or drink caffeinated beverages for at least 30 minutes before. . Take your blood pressure before (not after) you eat. . Sit comfortably with your back supported and both feet on the floor (don't cross your legs). . Elevate your arm to heart level on a table or a desk. . Use the proper sized cuff. It should fit smoothly and snugly around your bare upper arm. There should be enough room to slip a fingertip under the cuff. The bottom edge of the cuff should be 1 inch above the crease of the elbow. . Ideally, take 3 measurements at one sitting and record the average.    

## 2020-09-03 ENCOUNTER — Encounter: Payer: Self-pay | Admitting: Pharmacist

## 2020-09-03 NOTE — Assessment & Plan Note (Addendum)
Blood pressure is at goal during office visit , but remains slightly above goal most of the time at home. Patient is tolerating chlorthalidone monotherapy and BMET shows stable renal function and electrolytes.   Patient has an adequate amount of physical therapy with 15-30 minutes daily walks and reports not adding salt to her food. Noted she still eating significant amount of take out like [place] food, pizza, and [place] food. We discussed need to avoid bacon and cold cuts of meats or use  low sodium alternatives. Also instructed to use low sodium salad dressing. Should avoid [place] food sauces and soy sauce or use low sodium alternatives as well. Hydration should be water and beverages without caffeine. Encouraged to avoid drinking Gatorade or electrolytes replacements.   Plan to follow up in 8 weeks to assess home BP readings and impact of decreased sodium in diet. Will add amlodipine 5mg  to her current regimen if additional BP control is needed at follow up.

## 2020-09-23 ENCOUNTER — Other Ambulatory Visit: Payer: Self-pay

## 2020-09-23 ENCOUNTER — Ambulatory Visit (INDEPENDENT_AMBULATORY_CARE_PROVIDER_SITE_OTHER): Payer: No Typology Code available for payment source | Admitting: Cardiovascular Disease

## 2020-09-23 ENCOUNTER — Encounter: Payer: Self-pay | Admitting: Cardiovascular Disease

## 2020-09-23 VITALS — BP 136/96 | HR 91 | Ht 64.0 in | Wt 243.6 lb

## 2020-09-23 DIAGNOSIS — R0683 Snoring: Secondary | ICD-10-CM | POA: Diagnosis not present

## 2020-09-23 DIAGNOSIS — R400 Somnolence: Secondary | ICD-10-CM | POA: Diagnosis not present

## 2020-09-23 NOTE — Patient Instructions (Signed)
Medication Instructions:  The current medical regimen is effective;  continue present plan and medications.  *If you need a refill on your cardiac medications before your next appointment, please call your pharmacy*  Testing/Procedures: Your physician has recommended that you have a sleep study. This test records several body functions during sleep, including: brain activity, eye movement, oxygen and carbon dioxide blood levels, heart rate and rhythm, breathing rate and rhythm, the flow of air through your mouth and nose, snoring, body muscle movements, and chest and belly movement.   Follow-Up: At [facility], you and your health needs are our priority.  As part of our continuing mission to provide you with exceptional heart care, we have created designated Provider Care Teams.  These Care Teams include your primary Cardiologist (physician) and Advanced Practice Providers (APPs -  Physician Assistants and Nurse Practitioners) who all work together to provide you with the care you need, when you need it.  We recommend signing up for the patient portal called "MyChart".  Sign up information is provided on this After Visit Summary.  MyChart is used to connect with patients for Virtual Visits (Telemedicine).  Patients are able to view lab/test results, encounter notes, upcoming appointments, etc.  Non-urgent messages can be sent to your provider as well.   To learn more about what you can do with MyChart, go to [email or web address].    Your next appointment:   6 month(s)  The format for your next appointment:   In Person  Provider:   Quay Burow, MD

## 2020-09-23 NOTE — Progress Notes (Signed)
Tasha Valencia returns a for follow-up of hypertension.  She did see our Pharm.D. who gave her excellent dietary advice.  She is on chlorthalidone.  Her blood pressures at home have been running in the mid 120 over mid 80 range.  She is otherwise asymptomatic.  Her lipid profile is acceptable for primary prevention with an LDL of 98.  She does relate symptoms compatible with obstructive sleep apnea with daytime somnolence and nocturnal snoring in the setting of moderate obesity.  I am going to get an outpatient sleep study to further evaluate.  I will see her back in 6 months.  [name], M.D., [place], [facility], [name] [address]. [place], [place]  [address]  [phone] 09/23/2020 4:13 PM

## 2020-10-28 ENCOUNTER — Ambulatory Visit: Payer: No Typology Code available for payment source

## 2021-04-01 ENCOUNTER — Telehealth: Payer: Self-pay | Admitting: Cardiovascular Disease

## 2021-04-01 MED ORDER — CHLORTHALIDONE 25 MG PO TABS: 25.0000 mg | ORAL_TABLET | Freq: Every day | ORAL | 2 refills | Status: DC

## 2021-04-01 NOTE — Telephone Encounter (Signed)
*  STAT* If patient is at the pharmacy, call can be transferred to refill team.   1. Which medications need to be refilled? (please list name of each medication and dose if known) chlorthalidone (HYGROTON) 25 MG tablet(Expired)  2. Which pharmacy/location (including street and city if local pharmacy) is medication to be sent to? [id number] - [place], [place] [address] AT [address]  3. Do they need a 30 day or 90 day supply? Pt has an appt scheduled for 05/15/21 Pt needs enough to get her through until her appt

## 2021-05-15 ENCOUNTER — Encounter: Payer: Self-pay | Admitting: Cardiovascular Disease

## 2021-05-15 ENCOUNTER — Ambulatory Visit (INDEPENDENT_AMBULATORY_CARE_PROVIDER_SITE_OTHER): Payer: No Typology Code available for payment source | Admitting: Cardiovascular Disease

## 2021-05-15 ENCOUNTER — Other Ambulatory Visit: Payer: Self-pay

## 2021-05-15 DIAGNOSIS — G4733 Obstructive sleep apnea (adult) (pediatric): Secondary | ICD-10-CM | POA: Diagnosis not present

## 2021-05-15 DIAGNOSIS — I10 Essential (primary) hypertension: Secondary | ICD-10-CM | POA: Diagnosis not present

## 2021-05-15 DIAGNOSIS — E785 Hyperlipidemia, unspecified: Secondary | ICD-10-CM | POA: Diagnosis not present

## 2021-05-15 NOTE — Assessment & Plan Note (Signed)
History of morbid obesity with a BMI of 39.  She has lost 20 pounds in the last year as a result of diet and exercise and plans to cut down to weight of 170.

## 2021-05-15 NOTE — Assessment & Plan Note (Signed)
History of essential hypertension blood pressure measured today 117/80.  She is on chlorthalidone.  She checks her blood pressure at home.

## 2021-05-15 NOTE — Patient Instructions (Signed)
Medication Instructions:  Your physician recommends that you continue on your current medications as directed. Please refer to the Current Medication list given to you today.  *If you need a refill on your cardiac medications before your next appointment, please call your pharmacy*   Lab Work: Your physician recommends that you return for lab work in: next 1-2 weeks for fasting lipid/liver profile.  If you have labs (blood work) drawn today and your tests are completely normal, you will receive your results only by: . MyChart Message (if you have MyChart) OR . A paper copy in the mail If you have any lab test that is abnormal or we need to change your treatment, we will call you to review the results.   Follow-Up: At [facility], you and your health needs are our priority.  As part of our continuing mission to provide you with exceptional heart care, we have created designated Provider Care Teams.  These Care Teams include your primary Cardiologist (physician) and Advanced Practice Providers (APPs -  Physician Assistants and Nurse Practitioners) who all work together to provide you with the care you need, when you need it.  We recommend signing up for the patient portal called "MyChart".  Sign up information is provided on this After Visit Summary.  MyChart is used to connect with patients for Virtual Visits (Telemedicine).  Patients are able to view lab/test results, encounter notes, upcoming appointments, etc.  Non-urgent messages can be sent to your provider as well.   To learn more about what you can do with MyChart, go to [email or web address].    Your next appointment:   12 month(s)  The format for your next appointment:   In Person  Provider:   Jonathan Berry, MD 

## 2021-05-15 NOTE — Assessment & Plan Note (Signed)
History of symptoms compatible with obstructive sleep apnea with nocturnal snoring and daytime somnolence along with a body habitus consistent with this.  We talked about getting an outpatient sleep study last year which she never pursued.  I think if she achieves her goal of a weight of 170 she may no longer need sleep apnea evaluation.  I will reassess next year.

## 2021-05-15 NOTE — Assessment & Plan Note (Signed)
History of dyslipidemia not on statin therapy with lipid profile performed 11/07/2019 revealing total cholesterol 59, LDL of 98 and HDL 44.  We will recheck a fasting lipid and liver profile.

## 2021-05-15 NOTE — Progress Notes (Signed)
05/15/2021 Tasha Valencia   Oct 28, 1971  [id number]  Primary Physician Forrest Moron, MD Primary Cardiologist: [name] MD [name], [place]  HPI:  Tasha Valencia is a 50 y.o.  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  She was referred by her OB/GYN, Dr. Alwyn Pea, for evaluation and treatment of hypertension.    I last saw her in the office 09/23/2020.  She basically has no cardiac risk factors.  She was on a medication remotely but stopped.  She was recently started on lisinopril hydrochlorothiazide with an excellent therapeutic effect however she did develop an ACE cough.  She denies chest pain or shortness of breath.  Since I saw her 8 months ago she continues to do well.  She has lost 20 pounds as a result of diet and exercise.  She still has symptoms of daytime somnolence.  She is committed to further weight loss down 12 weight of 170.  She is totally asymptomatic otherwise.   Current Meds  Medication Sig  . chlorthalidone (HYGROTON) 25 MG tablet Take 1 tablet (25 mg total) by mouth daily.  . Multiple Vitamin (MULTIVITAMIN WITH MINERALS) TABS tablet Take 1 tablet by mouth daily.  . potassium chloride SA (KLOR-CON) 20 MEQ tablet Take 1 tablet (20 mEq total) by mouth daily.  . [DISCONTINUED] phentermine (ADIPEX-P) 37.5 MG tablet Take 37.5 mg by mouth daily.     No Known Allergies  Social History   Socioeconomic History  . Marital status: Single    Spouse name: Not on file  . Number of children: Not on file  . Years of education: Not on file  . Highest education level: Not on file  Occupational History  . Not on file  Tobacco Use  . Smoking status: Never Smoker  . Smokeless tobacco: Never Used  Vaping Use  . Vaping Use: Never used  Substance and Sexual Activity  . Alcohol use: No  . Drug use: No  . Sexual activity: Not on file  Other Topics  Concern  . Not on file  Social History Narrative  . Not on file   Social Determinants of Health   Financial Resource Strain: Not on file  Food Insecurity: Not on file  Transportation Needs: Not on file  Physical Activity: Not on file  Stress: Not on file  Social Connections: Not on file  Intimate Partner Violence: Not on file     Review of Systems: General: negative for chills, fever, night sweats or weight changes.  Cardiovascular: negative for chest pain, dyspnea on exertion, edema, orthopnea, palpitations, paroxysmal nocturnal dyspnea or shortness of breath Dermatological: negative for rash Respiratory: negative for cough or wheezing Urologic: negative for hematuria Abdominal: negative for nausea, vomiting, diarrhea, bright red blood per rectum, melena, or hematemesis Neurologic: negative for visual changes, syncope, or dizziness All other systems reviewed and are otherwise negative except as noted above.    Blood pressure 117/80, pulse 82, height 5\' 4"  (1.626 m), weight 229 lb 6.4 oz (104.1 kg), last menstrual period 05/04/2019, SpO2 99 %.  General appearance: alert and no distress Neck: no adenopathy, no carotid bruit, no JVD, supple, symmetrical, trachea midline and thyroid not enlarged, symmetric, no tenderness/mass/nodules Lungs: clear to auscultation bilaterally Heart: regular rate and rhythm, S1, S2 normal, no murmur, click, rub or gallop Extremities: extremities normal, atraumatic, no cyanosis or edema Pulses: 2+ and symmetric Skin:  Skin color, texture, turgor normal. No rashes or lesions Neurologic: Alert and oriented X 3, normal strength and tone. Normal symmetric reflexes. Normal coordination and gait  EKG sinus rhythm at 82 without ST or T wave changes.  I personally reviewed this EKG.  ASSESSMENT AND PLAN:   MORBID OBESITY History of morbid obesity with a BMI of 39.  She has lost 20 pounds in the last year as a result of diet and exercise and plans to cut  down to weight of 170.  Dyslipidemia History of dyslipidemia not on statin therapy with lipid profile performed 11/07/2019 revealing total cholesterol 59, LDL of 98 and HDL 44.  We will recheck a fasting lipid and liver profile.  Hypertension History of essential hypertension blood pressure measured today 117/80.  She is on chlorthalidone.  She checks her blood pressure at home.  Obstructive sleep apnea History of symptoms compatible with obstructive sleep apnea with nocturnal snoring and daytime somnolence along with a body habitus consistent with this.  We talked about getting an outpatient sleep study last year which she never pursued.  I think if she achieves her goal of a weight of 170 she may no longer need sleep apnea evaluation.  I will reassess next year.      [name] MD FACP,FACC,FAHA, [place] 05/15/2021 12:00 PM

## 2021-05-27 LAB — LIPID PANEL
Chol/HDL Ratio: 4.1 ratio (ref 0.0–4.4)
Cholesterol, Total: 182 mg/dL (ref 100–199)
HDL: 44 mg/dL (ref >39)
LDL Chol Calc (NIH): 123 mg/dL — ABNORMAL HIGH (ref 0–99)
Triglycerides: 79 mg/dL (ref 0–149)
VLDL Cholesterol Cal: 15 mg/dL (ref 5–40)

## 2021-05-27 LAB — HEPATIC FUNCTION PANEL
ALT: 26 IU/L (ref 0–32)
AST: 21 IU/L (ref 0–40)
Albumin: 4.2 g/dL (ref 3.8–4.9)
Alkaline Phosphatase: 65 IU/L (ref 44–121)
Bilirubin Total: 0.6 mg/dL (ref 0.0–1.2)
Bilirubin, Direct: 0.16 mg/dL (ref 0.00–0.40)
Total Protein: 7.9 g/dL (ref 6.0–8.5)

## 2021-06-16 ENCOUNTER — Other Ambulatory Visit: Payer: Self-pay

## 2021-06-16 DIAGNOSIS — E785 Hyperlipidemia, unspecified: Secondary | ICD-10-CM

## 2021-06-16 LAB — HM PAP SMEAR: HPV, high-risk: NEGATIVE

## 2021-10-23 ENCOUNTER — Other Ambulatory Visit: Payer: Self-pay

## 2021-10-23 DIAGNOSIS — E785 Hyperlipidemia, unspecified: Secondary | ICD-10-CM

## 2021-10-24 LAB — LIPID PANEL
Chol/HDL Ratio: 3.7 ratio (ref 0.0–4.4)
Cholesterol, Total: 176 mg/dL (ref 100–199)
HDL: 47 mg/dL (ref >39)
LDL Chol Calc (NIH): 112 mg/dL — ABNORMAL HIGH (ref 0–99)
Triglycerides: 91 mg/dL (ref 0–149)
VLDL Cholesterol Cal: 17 mg/dL (ref 5–40)

## 2021-10-24 LAB — HEPATIC FUNCTION PANEL
ALT: 29 IU/L (ref 0–32)
AST: 22 IU/L (ref 0–40)
Albumin: 4.1 g/dL (ref 3.8–4.9)
Alkaline Phosphatase: 62 IU/L (ref 44–121)
Bilirubin Total: 0.6 mg/dL (ref 0.0–1.2)
Bilirubin, Direct: 0.15 mg/dL (ref 0.00–0.40)
Total Protein: 7.9 g/dL (ref 6.0–8.5)

## 2021-12-25 ENCOUNTER — Telehealth: Payer: Self-pay

## 2021-12-25 DIAGNOSIS — E785 Hyperlipidemia, unspecified: Secondary | ICD-10-CM

## 2021-12-25 NOTE — Telephone Encounter (Addendum)
-----   Message from [name], MD ----- LDL 112. Not at goal for primary prevention. Get a CCS to help stratify  Spoke with pt regarding lab results and coronary calcium score that Dr. Gwenlyn Found would like to get. Pt is agreeable with this moving forward. Orders placed for calcium score. Information sent to pt via mychart. Will send message to scheduling to get appointment set up for pt. Pt verbalizes understanding.

## 2021-12-29 ENCOUNTER — Other Ambulatory Visit: Payer: Self-pay | Admitting: Cardiovascular Disease

## 2022-01-03 ENCOUNTER — Encounter: Payer: Self-pay | Admitting: Cardiovascular Disease

## 2022-01-19 ENCOUNTER — Encounter: Payer: Self-pay | Admitting: Cardiovascular Disease

## 2022-01-19 NOTE — Progress Notes (Signed)
Cardiology Office Note:    Date:  01/20/2022   ID:  Tasha Valencia, DOB 12-Nov-1971, MRN [id number]  PCP:  Merryl Hacker No   [facility] Providers Cardiologist:  Quay Burow, MD      Referring MD: Forrest Moron, MD   Presents for an evaluation of her blood pressure and chest discomfort  History of Present Illness:    Tasha Valencia is a 51 y.o. female with a hx of HTN, OSA, morbid obesity, vitamin D deficiency, prediabetes, and dyslipidemia.  She was initially sent by her PCP for evaluation and treatment of her hypertension.  She was seen by Dr. Gwenlyn Found on 05/15/2021.  During that time she continued to do well.  She reported she has lost around 20 pounds with diet modification and exercise.  She did report some daytime somnolence.  She reported that she was trying to continue to lose weight and had a goal weight of 170 pounds.  Her chlorthalidone was continued.  She contacted the nurse triage line on 01/19/2022 and indicated that over the last several days she had been noticing dizziness, room spinning, and chest discomfort.  She wondered if it was related to her blood pressure medications.  She reported blood pressures above 121/80 and 141/84 with pulse is 58-69 range.  She presents to the clinic today for follow-up evaluation states she feels well today.  She reports that she occasionally notices episodes of dizziness.  Prior to yesterday her last episode of dizziness was about 3 to 4 months ago.  We reviewed her symptoms and she reports that she does not hydrate well.  After noticing her episode yesterday she drank around 96 ounces and her symptoms resolved.  She denies palpitations surrounding the episodes.  We reviewed her most recent lipid panel and the importance of high-fiber diet.  We reviewed her EKG.  She has just purchased a cardio trampoline which she plans on beginning to use.  She is also lost about a pound.  She hopes that she will continue to lose weight.  I will order a CBC  and BMP.  We will plan follow-up for 6 months.  Today she denies chest pain, shortness of breath, lower extremity edema, fatigue, palpitations, melena, hematuria, hemoptysis, diaphoresis, weakness, presyncope, syncope, orthopnea, and PND.   Past Medical History:  Diagnosis Date   Fibroids    UTERINE   GERD (gastroesophageal reflux disease)    Hypertension     Past Surgical History:  Procedure Laterality Date   ABDOMINAL HYSTERECTOMY  06/04/2019   CESAREAN SECTION  1991   CHOLECYSTECTOMY  2002   COLONOSCOPY     INTRAUTERINE DEVICE (IUD) INSERTION  2008   IUD REMOVAL N/A 08/27/2015   Procedure: INTRAUTERINE DEVICE (IUD) REMOVAL;  Surgeon: Sanjuana Kava, MD;  Location: [place];  Service: Gynecology;  Laterality: N/A;   LAPAROSCOPIC TUBAL LIGATION Bilateral 08/27/2015   Procedure: LAPAROSCOPIC TUBAL LIGATION;  Surgeon: Sanjuana Kava, MD;  Location: [place];  Service: Gynecology;  Laterality: Bilateral;  FILSHIE CLIPS   SUPRACERVICAL ABDOMINAL HYSTERECTOMY Bilateral 06/04/2019   Procedure: HYSTERECTOMY SUPRACERVICAL ABDOMINAL WITH BILATERAL SALPINGECTOMY;  Surgeon: Sanjuana Kava, MD;  Location: [place];  Service: Gynecology;  Laterality: Bilateral;    Current Medications: Current Meds  Medication Sig   chlorthalidone (HYGROTON) 25 MG tablet TAKE 1 TABLET (25 MG TOTAL) BY MOUTH DAILY.   Cholecalciferol (VITAMIN D-3) 25 MCG (1000 UT) CAPS Take by mouth daily.   Ferrous Sulfate (IRON) 325 (65 Fe)  MG TABS Take by mouth daily.   vitamin C (ASCORBIC ACID) 500 MG tablet Take 500 mg by mouth daily.     Allergies:   Patient has no known allergies.   Social History   Socioeconomic History   Marital status: Single    Spouse name: Not on file   Number of children: Not on file   Years of education: Not on file   Highest education level: Not on file  Occupational History   Not on file  Tobacco Use   Smoking status: Never   Smokeless tobacco: Never  Vaping Use    Vaping Use: Never used  Substance and Sexual Activity   Alcohol use: No   Drug use: No   Sexual activity: Not on file  Other Topics Concern   Not on file  Social History Narrative   Not on file   Social Determinants of Health   Financial Resource Strain: Not on file  Food Insecurity: Not on file  Transportation Needs: Not on file  Physical Activity: Not on file  Stress: Not on file  Social Connections: Not on file     Family History: The patient's family history is not on file.  ROS:   Please see the history of present illness.     All other systems reviewed and are negative.   Risk Assessment/Calculations:           Physical Exam:    VS:  BP 110/80    Pulse 72    Ht 5\' 4"  (1.626 m)    Wt 228 lb 1.6 oz (103.5 kg)    LMP 05/04/2019    SpO2 98%    BMI 39.15 kg/m     Wt Readings from Last 3 Encounters:  01/20/22 228 lb 1.6 oz (103.5 kg)  2021-05-21 229 lb 6.4 oz (104.1 kg)  09/23/20 243 lb 9.6 oz (110.5 kg)     GEN:  Well nourished, well developed in no acute distress HEENT: Normal NECK: No JVD; No carotid bruits LYMPHATICS: No lymphadenopathy CARDIAC: RRR, no murmurs, rubs, gallops RESPIRATORY:  Clear to auscultation without rales, wheezing or rhonchi  ABDOMEN: Soft, non-tender, non-distended MUSCULOSKELETAL:  No edema; No deformity  SKIN: Warm and dry NEUROLOGIC:  Alert and oriented x 3 PSYCHIATRIC:  Normal affect    EKGs/Labs/Other Studies Reviewed:    The following studies were reviewed today: EKG 2021-05-21  Normal sinus rhythm possible left atrial enlargement 82 bpm  EKG:  EKG is  ordered today.  The ekg ordered today demonstrates normal sinus rhythm 72 bpm no ST or T wave deviation.  Recent Labs: 10/23/2021: ALT 29  Recent Lipid Panel    Component Value Date/Time   CHOL 176 10/23/2021 1707   TRIG 91 10/23/2021 1707   HDL 47 10/23/2021 1707   CHOLHDL 3.7 10/23/2021 1707   CHOLHDL 3.7 04/09/2013 1503   VLDL 15 04/09/2013 1503   LDLCALC  112 (H) 10/23/2021 1707    ASSESSMENT & PLAN    Essential hypertension-BP today 110/8.  Has been well controlled at home.  Reports compliance with chlorthalidone. Continue chlorthalidone Heart healthy low-sodium diet-salty 6 given Increase physical activity as tolerated Order CBC, BMP  Chest discomfort-atypical in nature.  Brief and infrequent.  Notices only with mild activity. Heart healthy low-sodium diet Increase physical activity as tolerated No plans for ischemic evaluation at this time. Reassured that symptoms were not related to cardiac issues.  Obstructive sleep apnea-continues with some daytime somnolence.  She continues to work on  weight loss.  Felt that she may not have apneic episodes when she reaches her weight loss goal. Continue weight loss Increase physical activity as tolerated-just purchased a cardiac trampoline  Morbid obesity-weight today 228.1.  Continues with increased physical activity and diet modifications. Congratulated on weight loss. Continue diet and increase physical activity.  Disposition: Follow-up with Dr. Gwenlyn Found in 6-9 months.       Medication Adjustments/Labs and Tests Ordered: Current medicines are reviewed at length with the patient today.  Concerns regarding medicines are outlined above.  Orders Placed This Encounter  Procedures   Basic metabolic panel   CBC   EKG 12-Lead   No orders of the defined types were placed in this encounter.   Patient Instructions  Medication Instructions:  Your Physician recommend you continue on your current medication as directed.    *If you need a refill on your cardiac medications before your next appointment, please call your pharmacy*   Lab Work: Your physician recommends that you return for lab work today- BMP, CBC  If you have labs (blood work) drawn today and your tests are completely normal, you will receive your results only by: MyChart Message (if you have MyChart) OR A paper copy in the  mail If you have any lab test that is abnormal or we need to change your treatment, we will call you to review the results.   Testing/Procedures: None ordered today    Follow-Up: At [facility], you and your health needs are our priority.  As part of our continuing mission to provide you with exceptional heart care, we have created designated Provider Care Teams.  These Care Teams include your primary Cardiologist (physician) and Advanced Practice Providers (APPs -  Physician Assistants and Nurse Practitioners) who all work together to provide you with the care you need, when you need it.  We recommend signing up for the patient portal called "MyChart".  Sign up information is provided on this After Visit Summary.  MyChart is used to connect with patients for Virtual Visits (Telemedicine).  Patients are able to view lab/test results, encounter notes, upcoming appointments, etc.  Non-urgent messages can be sent to your provider as well.   To learn more about what you can do with MyChart, go to [email or web address].    Your next appointment:   6-9 month(s)  The format for your next appointment:   In Person  Provider:  Ancil Linsey, MD or Coletta Memos, NP     Other Instructions Exercise recommendations: The American Heart Association recommends 150 minutes of moderate intensity exercise weekly. Try 30 minutes of moderate intensity exercise 4-5 times per week. This could include walking, jogging, or swimming.  Increase your fluid intake to 64oz of water per day!       Signed, Deberah Pelton, NP  01/20/2022 8:28 AM      Notice: This dictation was prepared with Dragon dictation along with smaller phrase technology. Any transcriptional errors that result from this process are unintentional and may not be corrected upon review.  I spent 14 minutes examining this patient, reviewing medications, and using patient centered shared decision making involving her cardiac care.   Prior to her visit I spent greater than 20 minutes reviewing her past medical history,  medications, and prior cardiac tests.

## 2022-01-20 ENCOUNTER — Ambulatory Visit (INDEPENDENT_AMBULATORY_CARE_PROVIDER_SITE_OTHER): Payer: No Typology Code available for payment source | Admitting: General Practice

## 2022-01-20 ENCOUNTER — Encounter (HOSPITAL_BASED_OUTPATIENT_CLINIC_OR_DEPARTMENT_OTHER): Payer: Self-pay | Admitting: General Practice

## 2022-01-20 ENCOUNTER — Other Ambulatory Visit: Payer: Self-pay

## 2022-01-20 VITALS — BP 110/80 | HR 72 | Ht 64.0 in | Wt 228.1 lb

## 2022-01-20 DIAGNOSIS — G4733 Obstructive sleep apnea (adult) (pediatric): Secondary | ICD-10-CM

## 2022-01-20 DIAGNOSIS — R0789 Other chest pain: Secondary | ICD-10-CM | POA: Diagnosis not present

## 2022-01-20 DIAGNOSIS — I10 Essential (primary) hypertension: Secondary | ICD-10-CM

## 2022-01-20 LAB — BASIC METABOLIC PANEL
BUN/Creatinine Ratio: 14 (ref 9–23)
BUN: 10 mg/dL (ref 6–24)
CO2: 27 mmol/L (ref 20–29)
Calcium: 9.8 mg/dL (ref 8.7–10.2)
Chloride: 99 mmol/L (ref 96–106)
Creatinine, Ser: 0.71 mg/dL (ref 0.57–1.00)
Glucose: 93 mg/dL (ref 70–99)
Potassium: 3.5 mmol/L (ref 3.5–5.2)
Sodium: 141 mmol/L (ref 134–144)
eGFR: 103 mL/min/{1.73_m2} (ref >59)

## 2022-01-20 LAB — CBC
Hematocrit: 41.6 % (ref 34.0–46.6)
Hemoglobin: 14.1 g/dL (ref 11.1–15.9)
MCH: 29.1 pg (ref 26.6–33.0)
MCHC: 33.9 g/dL (ref 31.5–35.7)
MCV: 86 fL (ref 79–97)
Platelets: 419 10*3/uL (ref 150–450)
RBC: 4.85 x10E6/uL (ref 3.77–5.28)
RDW: 12.9 % (ref 11.7–15.4)
WBC: 5.6 10*3/uL (ref 3.4–10.8)

## 2022-01-20 NOTE — Patient Instructions (Signed)
Medication Instructions:  Your Physician recommend you continue on your current medication as directed.    *If you need a refill on your cardiac medications before your next appointment, please call your pharmacy*   Lab Work: Your physician recommends that you return for lab work today- BMP, CBC  If you have labs (blood work) drawn today and your tests are completely normal, you will receive your results only by: MyChart Message (if you have MyChart) OR A paper copy in the mail If you have any lab test that is abnormal or we need to change your treatment, we will call you to review the results.   Testing/Procedures: None ordered today    Follow-Up: At [facility], you and your health needs are our priority.  As part of our continuing mission to provide you with exceptional heart care, we have created designated Provider Care Teams.  These Care Teams include your primary Cardiologist (physician) and Advanced Practice Providers (APPs -  Physician Assistants and Nurse Practitioners) who all work together to provide you with the care you need, when you need it.  We recommend signing up for the patient portal called "MyChart".  Sign up information is provided on this After Visit Summary.  MyChart is used to connect with patients for Virtual Visits (Telemedicine).  Patients are able to view lab/test results, encounter notes, upcoming appointments, etc.  Non-urgent messages can be sent to your provider as well.   To learn more about what you can do with MyChart, go to [email or web address].    Your next appointment:   6-9 month(s)  The format for your next appointment:   In Person  Provider:  Ancil Linsey, MD or Coletta Memos, NP     Other Instructions Exercise recommendations: The American Heart Association recommends 150 minutes of moderate intensity exercise weekly. Try 30 minutes of moderate intensity exercise 4-5 times per week. This could include walking, jogging, or  swimming.  Increase your fluid intake to 64oz of water per day!

## 2022-02-16 ENCOUNTER — Other Ambulatory Visit: Payer: Self-pay

## 2022-02-16 ENCOUNTER — Ambulatory Visit (INDEPENDENT_AMBULATORY_CARE_PROVIDER_SITE_OTHER)
Admission: RE | Admit: 2022-02-16 | Discharge: 2022-02-16 | Disposition: A | Discharge status: Home / Self Care | Payer: Self-pay | Source: Ambulatory Visit | Attending: Cardiovascular Disease | Admitting: Cardiovascular Disease

## 2022-02-16 DIAGNOSIS — E785 Hyperlipidemia, unspecified: Secondary | ICD-10-CM

## 2022-05-21 ENCOUNTER — Encounter: Payer: Self-pay | Admitting: Orthopedic Surgery

## 2022-05-21 ENCOUNTER — Ambulatory Visit (INDEPENDENT_AMBULATORY_CARE_PROVIDER_SITE_OTHER): Payer: No Typology Code available for payment source | Admitting: Orthopedic Surgery

## 2022-05-21 DIAGNOSIS — R200 Anesthesia of skin: Secondary | ICD-10-CM | POA: Diagnosis not present

## 2022-05-21 DIAGNOSIS — R202 Paresthesia of skin: Secondary | ICD-10-CM

## 2022-05-21 NOTE — Progress Notes (Signed)
Office Visit Note   Patient: Tasha Valencia           Date of Birth: 1971-01-15           MRN: [id number] Visit Date: 05/21/2022              Requested by: No referring provider defined for this encounter. PCP: Pcp, No   Assessment & Plan: Visit Diagnoses:  1. Numbness and tingling in right hand     Plan: Discussed with patient that her history exam findings seem consistent with carpal tunnel syndrome.  We briefly reviewed the nature of carpal tunnel syndrome as well as the diagnosis, prognosis, both conservative and surgical treatment options.  I would like to send her for an EMG/nerve conduction study to further evaluate her symptoms.  I can see her back in the office once the electrodiagnostic study is completed.  In the meantime, she can wear a brace on the right wrist to hopefully help with her nocturnal symptoms.  Follow-Up Instructions: No follow-ups on file.   Orders:  No orders of the defined types were placed in this encounter.  No orders of the defined types were placed in this encounter.     Procedures: No procedures performed   Clinical Data: No additional findings.   Subjective: Chief Complaint  Patient presents with   Right Hand - New Patient (Initial Visit)    This is a 51 year old right-hand-dominant female who works for [employer] and a lot of typing who presents with numbness and paresthesias involving the right hand.'s been going on for over a year now.  She describes numbness and tingling in the thumb, index, middle fingers.  This sensation radiates proximally up her into her forearm.  She has numbness during the day with certain activities.  She has nocturnal symptoms 4 nights per week and when she wakes with burning pain in the hand and numbness and paresthesias involving the previously mentioned fingers.  She has worn a neoprene sleeve but this has not helped much.  Has not had an EMG or nerve conduction study.  She has no history of  diabetes, hypothyroidism, wrist trauma, cervical spine issue, or inflammatory arthropathy.   Review of Systems   Objective: Vital Signs: LMP 05/04/2019   Physical Exam Constitutional:      Appearance: Normal appearance.  Cardiovascular:     Rate and Rhythm: Normal rate.     Pulses: Normal pulses.  Pulmonary:     Effort: Pulmonary effort is normal.  Skin:    General: Skin is warm and dry.     Capillary Refill: Capillary refill takes less than 2 seconds.  Neurological:     Mental Status: She is alert.    Right Hand Exam   Tenderness  The patient is experiencing no tenderness.   Range of Motion  The patient has normal right wrist ROM.   Other  Erythema: absent Sensation: normal Pulse: present  Comments:  Positive Tinel, Phalen, and Durkan signs.  5/5 thenar motor strength w/ no atrophy.  Negative Tinel at elbow.      Specialty Comments:  No specialty comments available.  Imaging: No results found.   PMFS History: Patient Active Problem List   Diagnosis Date Noted   Numbness and tingling in right hand 05/21/2022   Obstructive sleep apnea 05/15/2021   Uterine fibroid 06/04/2019   Leiomyoma of uterus, unspecified 05/31/2019   Routine general medical examination at a health care facility 06/01/2012   Contraceptive device, intrauterine  06/01/2012   Prediabetes 02/07/2012   Dyslipidemia 02/07/2012   Hypertension 02/07/2012   Vitamin D deficiency 09/16/2011   MORBID OBESITY 10/09/2008   Past Medical History:  Diagnosis Date   Fibroids    UTERINE   GERD (gastroesophageal reflux disease)    Hypertension     History reviewed. No pertinent family history.  Past Surgical History:  Procedure Laterality Date   ABDOMINAL HYSTERECTOMY  06/04/2019   CESAREAN SECTION  1991   CHOLECYSTECTOMY  2002   COLONOSCOPY     INTRAUTERINE DEVICE (IUD) INSERTION  2008   IUD REMOVAL N/A 08/27/2015   Procedure: INTRAUTERINE DEVICE (IUD) REMOVAL;  Surgeon: Sanjuana Kava, MD;   Location: [place];  Service: Gynecology;  Laterality: N/A;   LAPAROSCOPIC TUBAL LIGATION Bilateral 08/27/2015   Procedure: LAPAROSCOPIC TUBAL LIGATION;  Surgeon: Sanjuana Kava, MD;  Location: [place];  Service: Gynecology;  Laterality: Bilateral;  FILSHIE CLIPS   SUPRACERVICAL ABDOMINAL HYSTERECTOMY Bilateral 06/04/2019   Procedure: HYSTERECTOMY SUPRACERVICAL ABDOMINAL WITH BILATERAL SALPINGECTOMY;  Surgeon: Sanjuana Kava, MD;  Location: [place];  Service: Gynecology;  Laterality: Bilateral;   Social History   Occupational History   Not on file  Tobacco Use   Smoking status: Never   Smokeless tobacco: Never  Vaping Use   Vaping Use: Never used  Substance and Sexual Activity   Alcohol use: No   Drug use: No   Sexual activity: Not on file

## 2022-06-18 ENCOUNTER — Ambulatory Visit (INDEPENDENT_AMBULATORY_CARE_PROVIDER_SITE_OTHER): Payer: No Typology Code available for payment source | Admitting: Physical Medicine and Rehabilitation

## 2022-06-18 ENCOUNTER — Encounter: Payer: Self-pay | Admitting: Physical Medicine and Rehabilitation

## 2022-06-18 DIAGNOSIS — R202 Paresthesia of skin: Secondary | ICD-10-CM | POA: Diagnosis not present

## 2022-06-24 ENCOUNTER — Ambulatory Visit (INDEPENDENT_AMBULATORY_CARE_PROVIDER_SITE_OTHER): Payer: No Typology Code available for payment source | Admitting: Orthopedic Surgery

## 2022-06-24 DIAGNOSIS — G5601 Carpal tunnel syndrome, right upper limb: Secondary | ICD-10-CM | POA: Insufficient documentation

## 2022-06-24 NOTE — Progress Notes (Signed)
Office Visit Note   Patient: Tasha Valencia           Date of Birth: 1971/04/21           MRN: [id number] Visit Date: 06/24/2022              Requested by: No referring provider defined for this encounter. PCP: Pcp, No   Assessment & Plan: Visit Diagnoses:  1. Carpal tunnel syndrome, right upper limb     Plan: We reviewed patient's EMG/nerve conduction study which is consistent with moderate right carpal tunnel syndrome.  We again reviewed the nature of carpal tunnel syndrome as well as his diagnosis, prognosis, and both conservative and surgical treatment options.  We reviewed the risks of carpal tunnel release including bleeding, infection, damage to neurovascular structures, incomplete symptom relief, delayed wound healing, need for additional procedures.  After discussion, patient like to proceed with open right carpal tunnel release.  A surgical date and time will be confirmed with the patient.  Follow-Up Instructions: No follow-ups on file.   Orders:  No orders of the defined types were placed in this encounter.  No orders of the defined types were placed in this encounter.     Procedures: No procedures performed   Clinical Data: No additional findings.   Subjective: Chief Complaint  Patient presents with   Right Hand - Follow-up    EMG/NCS Review    This is a 51 year old right-hand-dominant female who presents for follow-up of the right hand numbness and paresthesias.  She underwent EMG/nerve conduction study on 06/17/2022 which suggested moderate right carpal tunnel syndrome.  She continues to describe numbness and paresthesias in the thumb, index, middle fingers.  She also has burning type pain in this hand.  She wakes 4-5 nights per week with the symptoms.  She is worn a sleeve which has not helped much.    Review of Systems   Objective: Vital Signs: LMP 05/04/2019   Physical Exam Constitutional:      Appearance: Normal appearance.  Cardiovascular:      Rate and Rhythm: Normal rate.     Pulses: Normal pulses.  Pulmonary:     Effort: Pulmonary effort is normal.  Skin:    General: Skin is warm and dry.     Capillary Refill: Capillary refill takes less than 2 seconds.  Neurological:     Mental Status: She is alert.     Right Hand Exam   Tenderness  The patient is experiencing no tenderness.   Other  Erythema: absent Sensation: normal Pulse: present  Comments:  Positive Tinel, Phalen, and Durkan signs.  5/5 thenar motor strength.       Specialty Comments:  No specialty comments available.  Imaging: No results found.   PMFS History: Patient Active Problem List   Diagnosis Date Noted   Carpal tunnel syndrome, right upper limb 06/24/2022   Numbness and tingling in right hand 05/21/2022   Obstructive sleep apnea 05/15/2021   Uterine fibroid 06/04/2019   Leiomyoma of uterus, unspecified 05/31/2019   Routine general medical examination at a health care facility 06/01/2012   Contraceptive device, intrauterine 06/01/2012   Prediabetes 02/07/2012   Dyslipidemia 02/07/2012   Hypertension 02/07/2012   Vitamin D deficiency 09/16/2011   MORBID OBESITY 10/09/2008   Past Medical History:  Diagnosis Date   Fibroids    UTERINE   GERD (gastroesophageal reflux disease)    Hypertension     No family history on file.  Past Surgical History:  Procedure Laterality Date   ABDOMINAL HYSTERECTOMY  06/04/2019   CESAREAN SECTION  1991   CHOLECYSTECTOMY  2002   COLONOSCOPY     INTRAUTERINE DEVICE (IUD) INSERTION  2008   IUD REMOVAL N/A 08/27/2015   Procedure: INTRAUTERINE DEVICE (IUD) REMOVAL;  Surgeon: Sanjuana Kava, MD;  Location: [place];  Service: Gynecology;  Laterality: N/A;   LAPAROSCOPIC TUBAL LIGATION Bilateral 08/27/2015   Procedure: LAPAROSCOPIC TUBAL LIGATION;  Surgeon: Sanjuana Kava, MD;  Location: [place];  Service: Gynecology;  Laterality: Bilateral;  FILSHIE CLIPS    SUPRACERVICAL ABDOMINAL HYSTERECTOMY Bilateral 06/04/2019   Procedure: HYSTERECTOMY SUPRACERVICAL ABDOMINAL WITH BILATERAL SALPINGECTOMY;  Surgeon: Sanjuana Kava, MD;  Location: [place];  Service: Gynecology;  Laterality: Bilateral;   Social History   Occupational History   Not on file  Tobacco Use   Smoking status: Never   Smokeless tobacco: Never  Vaping Use   Vaping Use: Never used  Substance and Sexual Activity   Alcohol use: No   Drug use: No   Sexual activity: Not on file

## 2022-07-18 ENCOUNTER — Inpatient Hospital Stay
Admission: RE | Admit: 2022-07-18 | Discharge: 2022-07-18 | Disposition: A | Discharge status: Home / Self Care | Payer: No Typology Code available for payment source | Source: Ambulatory Visit

## 2022-07-18 ENCOUNTER — Ambulatory Visit
Admission: EM | Admit: 2022-07-18 | Discharge: 2022-07-18 | Disposition: A | Discharge status: Home / Self Care | Payer: No Typology Code available for payment source | Attending: Family Medicine | Admitting: Family Medicine

## 2022-07-18 DIAGNOSIS — H6123 Impacted cerumen, bilateral: Secondary | ICD-10-CM | POA: Diagnosis not present

## 2022-07-18 NOTE — ED Notes (Signed)
Right Ear Fullness for 2-3 weeks, has been using OTC drop, little relief but still feels as every is muffled sounding.

## 2022-07-18 NOTE — ED Provider Notes (Signed)
[facility]    CSN: [id number] Arrival date & time: 07/18/22  1307      History   Chief Complaint Chief Complaint  Patient presents with   Ear Fullness    Entered by patient    HPI Tasha Valencia is a 51 y.o. female.   Presenting today with several week history of right ear muffled hearing, fullness.  Denies pain, drainage, fever, chills, headache.  Has tried wax removal kits over-the-counter with no relief.    Past Medical History:  Diagnosis Date   Fibroids    UTERINE   GERD (gastroesophageal reflux disease)    Hypertension     Patient Active Problem List   Diagnosis Date Noted   Carpal tunnel syndrome, right upper limb 06/24/2022   Numbness and tingling in right hand 05/21/2022   Obstructive sleep apnea 05/15/2021   Uterine fibroid 06/04/2019   Leiomyoma of uterus, unspecified 05/31/2019   Routine general medical examination at a health care facility 06/01/2012   Contraceptive device, intrauterine 06/01/2012   Prediabetes 02/07/2012   Dyslipidemia 02/07/2012   Hypertension 02/07/2012   Vitamin D deficiency 09/16/2011   MORBID OBESITY 10/09/2008    Past Surgical History:  Procedure Laterality Date   ABDOMINAL HYSTERECTOMY  06/04/2019   CESAREAN SECTION  1991   CHOLECYSTECTOMY  2002   COLONOSCOPY     INTRAUTERINE DEVICE (IUD) INSERTION  2008   IUD REMOVAL N/A 08/27/2015   Procedure: INTRAUTERINE DEVICE (IUD) REMOVAL;  Surgeon: Sanjuana Kava, MD;  Location: [facility];  Service: Gynecology;  Laterality: N/A;   LAPAROSCOPIC TUBAL LIGATION Bilateral 08/27/2015   Procedure: LAPAROSCOPIC TUBAL LIGATION;  Surgeon: Sanjuana Kava, MD;  Location: [facility];  Service: Gynecology;  Laterality: Bilateral;  FILSHIE CLIPS   SUPRACERVICAL ABDOMINAL HYSTERECTOMY Bilateral 06/04/2019   Procedure: HYSTERECTOMY SUPRACERVICAL ABDOMINAL WITH BILATERAL SALPINGECTOMY;  Surgeon: Sanjuana Kava, MD;  Location: [facility];  Service: Gynecology;   Laterality: Bilateral;    OB History   No obstetric history on file.      Home Medications    Prior to Admission medications   Medication Sig Start Date End Date Taking? Authorizing Provider  chlorthalidone (HYGROTON) 25 MG tablet TAKE 1 TABLET (25 MG TOTAL) BY MOUTH DAILY. 12/29/21 03/29/22  [name], MD  Cholecalciferol (VITAMIN D-3) 25 MCG (1000 UT) CAPS Take by mouth daily.    [provider]  Ferrous Sulfate (IRON) 325 (65 Fe) MG TABS Take by mouth daily.    [provider]  vitamin C (ASCORBIC ACID) 500 MG tablet Take 500 mg by mouth daily.    [provider]    Family History History reviewed. No pertinent family history.  Social History Social History   Tobacco Use   Smoking status: Never   Smokeless tobacco: Never  Vaping Use   Vaping Use: Never used  Substance Use Topics   Alcohol use: No   Drug use: No     Allergies   Patient has no known allergies.   Review of Systems Review of Systems Per HPI  Physical Exam Triage Vital Signs ED Triage Vitals  Enc Vitals Group     BP 07/18/22 1311 119/82     Pulse Rate 07/18/22 1311 67     Resp --      Temp 07/18/22 1311 98.2 F (36.8 C)     Temp Source 07/18/22 1311 Oral     SpO2 07/18/22 1311 96 %     Weight --  Height --      Head Circumference --      Peak Flow --      Pain Score 07/18/22 1316 0     Pain Loc --      Pain Edu? --      Excl. in [place]? --    No data found.  Updated Vital Signs BP 119/82 (BP Location: Left Arm)   Pulse 67   Temp 98.2 F (36.8 C) (Oral)   LMP 05/04/2019   SpO2 96%   Visual Acuity Right Eye Distance:   Left Eye Distance:   Bilateral Distance:    Right Eye Near:   Left Eye Near:    Bilateral Near:     Physical Exam Vitals and nursing note reviewed.  Constitutional:      Appearance: Normal appearance. She is not ill-appearing.  HENT:     Head: Atraumatic.     Right Ear: There is impacted cerumen.     Left Ear: There is  impacted cerumen.     Nose: Nose normal.     Mouth/Throat:     Mouth: Mucous membranes are moist.  Eyes:     Extraocular Movements: Extraocular movements intact.     Conjunctiva/sclera: Conjunctivae normal.  Cardiovascular:     Rate and Rhythm: Normal rate and regular rhythm.     Heart sounds: Normal heart sounds.  Pulmonary:     Effort: Pulmonary effort is normal.     Breath sounds: Normal breath sounds.  Musculoskeletal:        General: Normal range of motion.     Cervical back: Normal range of motion and neck supple.  Skin:    General: Skin is warm and dry.  Neurological:     Mental Status: She is alert and oriented to person, place, and time.  Psychiatric:        Mood and Affect: Mood normal.        Thought Content: Thought content normal.        Judgment: Judgment normal.      UC Treatments / Results  Labs (all labs ordered are listed, but only abnormal results are displayed) Labs Reviewed - No data to display  EKG   Radiology No results found.  Procedures Procedures (including critical care time)  Medications Ordered in UC Medications - No data to display  Initial Impression / Assessment and Plan / UC Course  I have reviewed the triage vital signs and the nursing notes.  Pertinent labs & imaging results that were available during my care of the patient were reviewed by me and considered in my medical decision making (see chart for details).     Ear lavage performed bilaterally with warm water and peroxide with clearance of cerumen impactions.  TMs visualized and benign post procedure.  Patient reports symptomatic benefit.  Discussed Debrox drops going forward for maintenance.  Return for worsening symptoms.  Final Clinical Impressions(s) / UC Diagnoses   Final diagnoses:  Bilateral impacted cerumen   Discharge Instructions   None    ED Prescriptions   None    PDMP not reviewed this encounter.   Volney American, [place] 07/18/22 1416

## 2022-07-18 NOTE — ED Triage Notes (Signed)
Right Ear Fullness for 2-3 weeks, has been using OTC drop, little relief but still feels as every is muffled sounding.

## 2022-07-21 ENCOUNTER — Other Ambulatory Visit: Payer: Self-pay

## 2022-07-21 ENCOUNTER — Encounter (HOSPITAL_BASED_OUTPATIENT_CLINIC_OR_DEPARTMENT_OTHER): Payer: Self-pay | Admitting: Orthopedic Surgery

## 2022-07-23 ENCOUNTER — Telehealth: Payer: Self-pay | Admitting: Orthopedic Surgery

## 2022-07-23 NOTE — Telephone Encounter (Signed)
[facility] day surgery needs order put in for 07/28/22, Thank you.

## 2022-07-26 ENCOUNTER — Encounter (HOSPITAL_BASED_OUTPATIENT_CLINIC_OR_DEPARTMENT_OTHER)
Admission: RE | Admit: 2022-07-26 | Discharge: 2022-07-26 | Disposition: A | Discharge status: Home / Self Care | Payer: No Typology Code available for payment source | Source: Ambulatory Visit | Attending: Orthopedic Surgery | Admitting: Orthopedic Surgery

## 2022-07-26 DIAGNOSIS — Z01812 Encounter for preprocedural laboratory examination: Secondary | ICD-10-CM | POA: Diagnosis present

## 2022-07-26 DIAGNOSIS — G5601 Carpal tunnel syndrome, right upper limb: Secondary | ICD-10-CM | POA: Diagnosis present

## 2022-07-26 LAB — BASIC METABOLIC PANEL
Anion gap: 7 (ref 5–15)
BUN: 8 mg/dL (ref 6–20)
CO2: 33 mmol/L — ABNORMAL HIGH (ref 22–32)
Calcium: 9.3 mg/dL (ref 8.9–10.3)
Chloride: 99 mmol/L (ref 98–111)
Creatinine, Ser: 0.82 mg/dL (ref 0.44–1.00)
GFR, Estimated: >60 mL/min (ref >60)
Glucose, Bld: 101 mg/dL — ABNORMAL HIGH (ref 70–99)
Potassium: 2.9 mmol/L — ABNORMAL LOW (ref 3.5–5.1)
Sodium: 139 mmol/L (ref 135–145)

## 2022-07-26 NOTE — [place] Notes (Signed)
BMP results show a potassium of 2.9. Spoke with pt and she agreed to come in for repeat BMP tomorrow to avoid DOS repeat.

## 2022-07-27 ENCOUNTER — Encounter (HOSPITAL_BASED_OUTPATIENT_CLINIC_OR_DEPARTMENT_OTHER)
Admission: RE | Admit: 2022-07-27 | Discharge: 2022-07-27 | Disposition: A | Discharge status: Home / Self Care | Payer: No Typology Code available for payment source | Source: Ambulatory Visit | Attending: Orthopedic Surgery | Admitting: Orthopedic Surgery

## 2022-07-27 DIAGNOSIS — G5601 Carpal tunnel syndrome, right upper limb: Secondary | ICD-10-CM | POA: Diagnosis not present

## 2022-07-27 LAB — BASIC METABOLIC PANEL
Anion gap: 8 (ref 5–15)
BUN: 8 mg/dL (ref 6–20)
CO2: 31 mmol/L (ref 22–32)
Calcium: 9.5 mg/dL (ref 8.9–10.3)
Chloride: 98 mmol/L (ref 98–111)
Creatinine, Ser: 0.76 mg/dL (ref 0.44–1.00)
GFR, Estimated: >60 mL/min (ref >60)
Glucose, Bld: 92 mg/dL (ref 70–99)
Potassium: 3.2 mmol/L — ABNORMAL LOW (ref 3.5–5.1)
Sodium: 137 mmol/L (ref 135–145)

## 2022-07-27 NOTE — Anesthesia Preprocedure Evaluation (Signed)
Anesthesia Evaluation  Patient identified by MRN, date of birth, ID band Patient awake    Reviewed: Allergy & Precautions, NPO status , Patient's Chart, lab work & pertinent test results  Airway Mallampati: II  TM Distance: >3 FB Neck ROM: Full    Dental  (+) Missing, Dental Advisory Given,    Pulmonary sleep apnea ,    Pulmonary exam normal breath sounds clear to auscultation       Cardiovascular hypertension, Pt. on medications Normal cardiovascular exam Rhythm:Regular Rate:Normal     Neuro/Psych  Neuromuscular disease negative psych ROS   GI/Hepatic Neg liver ROS, GERD  ,  Endo/Other  Morbid obesity  Renal/GU      Musculoskeletal   Abdominal (+) + obese,   Peds  Hematology   Anesthesia Other Findings   Reproductive/Obstetrics negative OB ROS                           Lab Results  Component Value Date   WBC 5.6 01/20/2022   HGB 14.1 01/20/2022   HCT 41.6 01/20/2022   MCV 86 01/20/2022   PLT 419 01/20/2022   Lab Results  Component Value Date   CREATININE 0.76 07/27/2022   BUN 8 07/27/2022   NA 137 07/27/2022   K 3.2 (L) 07/27/2022   CL 98 07/27/2022   CO2 31 07/27/2022     Anesthesia Physical  Anesthesia Plan  ASA: 3  Anesthesia Plan: MAC   Post-op Pain Management: Tylenol PO (pre-op)*, Minimal or no pain anticipated and Celebrex PO (pre-op)*   Induction: Intravenous  PONV Risk Score and Plan: 2 and Propofol infusion, TIVA and Treatment may vary due to age or medical condition  Airway Management Planned:   Additional Equipment:   Intra-op Plan:   Post-operative Plan:   Informed Consent: I have reviewed the patients History and Physical, chart, labs and discussed the procedure including the risks, benefits and alternatives for the proposed anesthesia with the patient or authorized representative who has indicated his/her understanding and acceptance.      Dental advisory given  Plan Discussed with: CRNA  Anesthesia Plan Comments:        Anesthesia Quick Evaluation

## 2022-07-28 ENCOUNTER — Encounter (HOSPITAL_BASED_OUTPATIENT_CLINIC_OR_DEPARTMENT_OTHER): Payer: Self-pay | Admitting: Orthopedic Surgery

## 2022-07-28 ENCOUNTER — Ambulatory Visit (HOSPITAL_BASED_OUTPATIENT_CLINIC_OR_DEPARTMENT_OTHER): Payer: No Typology Code available for payment source | Admitting: Anesthesiology

## 2022-07-28 ENCOUNTER — Encounter (HOSPITAL_BASED_OUTPATIENT_CLINIC_OR_DEPARTMENT_OTHER)
Admission: RE | Disposition: A | Discharge status: Home / Self Care | Payer: Self-pay | Source: Home / Self Care | Attending: Orthopedic Surgery

## 2022-07-28 ENCOUNTER — Ambulatory Visit (HOSPITAL_BASED_OUTPATIENT_CLINIC_OR_DEPARTMENT_OTHER)
Admission: RE | Admit: 2022-07-28 | Discharge: 2022-07-28 | Disposition: A | Discharge status: Home / Self Care | Payer: No Typology Code available for payment source | Attending: Orthopedic Surgery | Admitting: Orthopedic Surgery

## 2022-07-28 ENCOUNTER — Other Ambulatory Visit: Payer: Self-pay

## 2022-07-28 DIAGNOSIS — G5601 Carpal tunnel syndrome, right upper limb: Secondary | ICD-10-CM | POA: Insufficient documentation

## 2022-07-28 DIAGNOSIS — I10 Essential (primary) hypertension: Secondary | ICD-10-CM

## 2022-07-28 DIAGNOSIS — G4730 Sleep apnea, unspecified: Secondary | ICD-10-CM | POA: Diagnosis not present

## 2022-07-28 DIAGNOSIS — Z01818 Encounter for other preprocedural examination: Secondary | ICD-10-CM

## 2022-07-28 HISTORY — PX: CARPAL TUNNEL RELEASE: SHX101

## 2022-07-28 HISTORY — DX: Prediabetes: R73.03

## 2022-07-28 SURGERY — CARPAL TUNNEL RELEASE
Anesthesia: Monitor Anesthesia Care | Site: Hand | Laterality: Right

## 2022-07-28 MED ORDER — CELECOXIB 200 MG PO CAPS
ORAL_CAPSULE | ORAL | Status: AC
  Filled 2022-07-28: qty 1

## 2022-07-28 MED ORDER — LIDOCAINE HCL 1 % IJ SOLN
INTRAMUSCULAR | Status: DC | PRN
  Administered 2022-07-28: 50 mg via INTRADERMAL

## 2022-07-28 MED ORDER — LIDOCAINE HCL (PF) 1 % IJ SOLN
INTRAMUSCULAR | Status: AC
  Filled 2022-07-28: qty 5

## 2022-07-28 MED ORDER — BUPIVACAINE HCL 0.25 % IJ SOLN
INTRAMUSCULAR | Status: DC | PRN
  Administered 2022-07-28: 10 mL

## 2022-07-28 MED ORDER — CELECOXIB 200 MG PO CAPS
200.0000 mg | ORAL_CAPSULE | Freq: Once | ORAL | Status: AC
  Administered 2022-07-28: 200 mg via ORAL

## 2022-07-28 MED ORDER — PROPOFOL 500 MG/50ML IV EMUL
INTRAVENOUS | Status: DC | PRN
  Administered 2022-07-28: 100 ug/kg/min via INTRAVENOUS

## 2022-07-28 MED ORDER — MIDAZOLAM HCL 5 MG/5ML IJ SOLN
INTRAMUSCULAR | Status: DC | PRN
  Administered 2022-07-28: 1 mg via INTRAVENOUS
  Administered 2022-07-28 (×2): .5 mg via INTRAVENOUS

## 2022-07-28 MED ORDER — ONDANSETRON HCL 4 MG/2ML IJ SOLN
INTRAMUSCULAR | Status: DC | PRN
  Administered 2022-07-28: 4 mg via INTRAVENOUS

## 2022-07-28 MED ORDER — OXYCODONE HCL 5 MG PO TABS: 5.0000 mg | ORAL_TABLET | Freq: Once | ORAL | Status: DC | PRN

## 2022-07-28 MED ORDER — DEXAMETHASONE SODIUM PHOSPHATE 10 MG/ML IJ SOLN
INTRAMUSCULAR | Status: AC
  Filled 2022-07-28: qty 1

## 2022-07-28 MED ORDER — DEXAMETHASONE SODIUM PHOSPHATE 10 MG/ML IJ SOLN
INTRAMUSCULAR | Status: DC | PRN
  Administered 2022-07-28: 5 mg via INTRAVENOUS

## 2022-07-28 MED ORDER — BUPIVACAINE HCL (PF) 0.25 % IJ SOLN
INTRAMUSCULAR | Status: AC
  Filled 2022-07-28: qty 30

## 2022-07-28 MED ORDER — LACTATED RINGERS IV SOLN: INTRAVENOUS | Status: DC

## 2022-07-28 MED ORDER — BUPIVACAINE HCL (PF) 0.5 % IJ SOLN
INTRAMUSCULAR | Status: AC
  Filled 2022-07-28: qty 30

## 2022-07-28 MED ORDER — ACETAMINOPHEN 500 MG PO TABS
1000.0000 mg | ORAL_TABLET | Freq: Once | ORAL | Status: AC
Start: 2022-07-28 — End: 2022-07-28
  Administered 2022-07-28: 1000 mg via ORAL

## 2022-07-28 MED ORDER — PROPOFOL 500 MG/50ML IV EMUL
INTRAVENOUS | Status: AC
  Filled 2022-07-28: qty 50

## 2022-07-28 MED ORDER — ACETAMINOPHEN 500 MG PO TABS
ORAL_TABLET | ORAL | Status: AC
  Filled 2022-07-28: qty 2

## 2022-07-28 MED ORDER — ONDANSETRON HCL 4 MG/2ML IJ SOLN
INTRAMUSCULAR | Status: AC
  Filled 2022-07-28: qty 2

## 2022-07-28 MED ORDER — VANCOMYCIN HCL 1000 MG IV SOLR
INTRAVENOUS | Status: AC
  Filled 2022-07-28: qty 20

## 2022-07-28 MED ORDER — LIDOCAINE 2% (20 MG/ML) 5 ML SYRINGE
INTRAMUSCULAR | Status: AC
  Filled 2022-07-28: qty 5

## 2022-07-28 MED ORDER — FENTANYL CITRATE (PF) 100 MCG/2ML IJ SOLN: 25.0000 ug | INTRAMUSCULAR | Status: DC | PRN

## 2022-07-28 MED ORDER — FENTANYL CITRATE (PF) 100 MCG/2ML IJ SOLN
INTRAMUSCULAR | Status: DC | PRN
Start: 2022-07-28 — End: 2022-07-28
  Administered 2022-07-28: 50 ug via INTRAVENOUS
  Administered 2022-07-28 (×2): 25 ug via INTRAVENOUS

## 2022-07-28 MED ORDER — MEPERIDINE HCL 25 MG/ML IJ SOLN: 6.2500 mg | INTRAMUSCULAR | Status: DC | PRN

## 2022-07-28 MED ORDER — PROPOFOL 10 MG/ML IV BOLUS
INTRAVENOUS | Status: DC | PRN
  Administered 2022-07-28: 10 mg via INTRAVENOUS

## 2022-07-28 MED ORDER — PROMETHAZINE HCL 25 MG/ML IJ SOLN: 6.2500 mg | INTRAMUSCULAR | Status: DC | PRN

## 2022-07-28 MED ORDER — OXYCODONE HCL 5 MG/5ML PO SOLN: 5.0000 mg | Freq: Once | ORAL | Status: DC | PRN

## 2022-07-28 MED ORDER — FENTANYL CITRATE (PF) 100 MCG/2ML IJ SOLN
INTRAMUSCULAR | Status: AC
  Filled 2022-07-28: qty 2

## 2022-07-28 MED ORDER — MIDAZOLAM HCL 2 MG/2ML IJ SOLN
INTRAMUSCULAR | Status: AC
  Filled 2022-07-28: qty 2

## 2022-07-28 SURGICAL SUPPLY — 40 items
APL PRP STRL LF DISP 70% ISPRP (MISCELLANEOUS) ×1
BLADE SURG 15 STRL LF DISP TIS (BLADE) ×1 IMPLANT
BLADE SURG 15 STRL SS (BLADE) ×2
BNDG CMPR 9X4 STRL LF SNTH (GAUZE/BANDAGES/DRESSINGS) ×1
BNDG ELASTIC 3X5.8 VLCR STR LF (GAUZE/BANDAGES/DRESSINGS) ×2 IMPLANT
BNDG ESMARK 4X9 LF (GAUZE/BANDAGES/DRESSINGS) ×2 IMPLANT
BNDG GAUZE DERMACEA FLUFF (GAUZE/BANDAGES/DRESSINGS) ×1
BNDG GAUZE DERMACEA FLUFF 4 (GAUZE/BANDAGES/DRESSINGS) ×1 IMPLANT
BNDG GZE DERMACEA 4 6PLY (GAUZE/BANDAGES/DRESSINGS) ×1
BNDG PLASTER X FAST 3X3 WHT LF (CAST SUPPLIES) IMPLANT
BNDG PLSTR 9X3 FST ST WHT (CAST SUPPLIES)
CHLORAPREP W/TINT 26 (MISCELLANEOUS) ×2 IMPLANT
CORD BIPOLAR FORCEPS 12FT (ELECTRODE) ×2 IMPLANT
COVER BACK TABLE 60X90IN (DRAPES) ×2 IMPLANT
COVER MAYO STAND STRL (DRAPES) ×2 IMPLANT
CUFF TOURN SGL QUICK 18X4 (TOURNIQUET CUFF) IMPLANT
CUFF TOURN SGL QUICK 24 (TOURNIQUET CUFF)
CUFF TRNQT CYL 24X4X16.5-23 (TOURNIQUET CUFF) IMPLANT
DRAPE EXTREMITY T 121X128X90 (DISPOSABLE) ×2 IMPLANT
DRAPE SURG 17X23 STRL (DRAPES) ×2 IMPLANT
GAUZE XEROFORM 1X8 LF (GAUZE/BANDAGES/DRESSINGS) ×2 IMPLANT
GLOVE BIO SURGEON STRL SZ7 (GLOVE) ×2 IMPLANT
GLOVE BIOGEL PI IND STRL 7.0 (GLOVE) ×1 IMPLANT
GLOVE BIOGEL PI INDICATOR 7.0 (GLOVE) ×1
GOWN STRL REUS W/ TWL LRG LVL3 (GOWN DISPOSABLE) ×2 IMPLANT
GOWN STRL REUS W/TWL LRG LVL3 (GOWN DISPOSABLE) ×4
NDL HYPO 25X1 1.5 SAFETY (NEEDLE) IMPLANT
NEEDLE HYPO 25X1 1.5 SAFETY (NEEDLE) IMPLANT
NS IRRIG 1000ML POUR BTL (IV SOLUTION) ×2 IMPLANT
PACK BASIN DAY SURGERY FS (CUSTOM PROCEDURE TRAY) ×2 IMPLANT
PAD CAST 3X4 CTTN HI CHSV (CAST SUPPLIES) ×1 IMPLANT
PADDING CAST COTTON 3X4 STRL (CAST SUPPLIES) ×2
SLEEVE SCD COMPRESS KNEE MED (STOCKING) IMPLANT
SUT ETHILON 4 0 PS 2 18 (SUTURE) ×2 IMPLANT
SUT MNCRL AB 3-0 PS2 18 (SUTURE) IMPLANT
SUT VICRYL 4-0 PS2 18IN ABS (SUTURE) IMPLANT
SYR BULB EAR ULCER 3OZ GRN STR (SYRINGE) ×2 IMPLANT
SYR CONTROL 10ML LL (SYRINGE) ×2 IMPLANT
TOWEL GREEN STERILE FF (TOWEL DISPOSABLE) ×4 IMPLANT
UNDERPAD 30X36 HEAVY ABSORB (UNDERPADS AND DIAPERS) ×2 IMPLANT

## 2022-07-28 NOTE — Discharge Instructions (Addendum)
 Charles Benfield, M.D. Hand Surgery  POST-OPERATIVE DISCHARGE INSTRUCTIONS   PRESCRIPTIONS: - You have been given a prescription to be taken as directed for post-operative pain control.  You may also take over the counter ibuprofen/aleve and tylenol for pain. Take this as directed on the packaging. Do not exceed 3000 mg tylenol/acetaminophen in 24 hours.  Ibuprofen 600-800 mg (3-4) tablets by mouth every 6 hours as needed for pain.   OR  Aleve 2 tablets by mouth every 12 hours (twice daily) as needed for pain.   AND/OR  Tylenol 1000 mg (2 tablets) every 8 hours as needed for pain.  - Please use your pain medication carefully, as refills are limited and you may not be provided with one.  As stated above, please use over the counter pain medicine - it will also be helpful with decreasing your swelling.    ANESTHESIA: -After your surgery, post-surgical discomfort or pain is likely. This discomfort can last several days to a few weeks. At certain times of the day your discomfort may be more intense.   Did you receive a nerve block?   - A nerve block can provide pain relief for one hour to two days after your surgery. As long as the nerve block is working, you will experience little or no sensation in the area the surgeon operated on.  - As the nerve block wears off, you will begin to experience pain or discomfort. It is very important that you begin taking your prescribed pain medication before the nerve block fully wears off. Treating your pain at the first sign of the block wearing off will ensure your pain is better controlled and more tolerable when full-sensation returns. Do not wait until the pain is intolerable, as the medicine will be less effective. It is better to treat pain in advance than to try and catch up.   General Anesthesia:  If you did not receive a nerve block during your surgery, you will need to start taking your pain medication shortly after your surgery and  should continue to do so as prescribed by your surgeon.     ICE AND ELEVATION: - You may use ice for the first 48-72 hours, but it is not critical.   - Motion of your fingers is very important to decrease the swelling.  - Elevation, as much as possible for the next 48 hours, is critical for decreasing swelling as well as for pain relief. Elevation means when you are seated or lying down, you hand should be at or above your heart. When walking, the hand needs to be at or above the level of your elbow.  - If the bandage gets too tight, it may need to be loosened. Please contact our office and we will instruct you in how to do this.    SURGICAL BANDAGES:  - Keep your dressing and/or splint clean and dry at all times.  You can remove your dressing 4 days from now and change with a dry dressing or Band-Aids as needed thereafter. - You may place a plastic bag over your bandage to shower, but be careful, do not get your bandages wet.  - After the bandages have been removed, it is OK to get the stitches wet in a shower or with hand washing. Do Not soak or submerge the wound yet. Please do not use lotions or creams on the stitches.      HAND THERAPY:  - You may not need any. If you do,   we will begin this at your follow up visit in the clinic.    ACTIVITY AND WORK: - You are encouraged to move any fingers which are not in the bandage.  - Light use of the fingers is allowed to assist the other hand with daily hygiene and eating, but strong gripping or lifting is often uncomfortable and should be avoided.  - You might miss a variable period of time from work and hopefully this issue has been discussed prior to surgery. You may not do any heavy work with your affected hand for about 2 weeks.    [facility] [address] [place],  [place]  [address] [phone]     No Tylenol or Ibuprofen until 1:30pm    Post Anesthesia Home Care Instructions  Activity: Get plenty of  rest for the remainder of the day. A responsible individual must stay with you for 24 hours following the procedure.  For the next 24 hours, DO NOT: -Drive a car -Paediatric nurse -Drink alcoholic beverages -Take any medication unless instructed by your physician -Make any legal decisions or sign important papers.  Meals: Start with liquid foods such as gelatin or soup. Progress to regular foods as tolerated. Avoid greasy, spicy, heavy foods. If nausea and/or vomiting occur, drink only clear liquids until the nausea and/or vomiting subsides. Call your physician if vomiting continues.  Special Instructions/Symptoms: Your throat may feel dry or sore from the anesthesia or the breathing tube placed in your throat during surgery. If this causes discomfort, gargle with warm salt water. The discomfort should disappear within 24 hours.  If you had a scopolamine patch placed behind your ear for the management of post- operative nausea and/or vomiting:  1. The medication in the patch is effective for 72 hours, after which it should be removed.  Wrap patch in a tissue and discard in the trash. Wash hands thoroughly with soap and water. 2. You may remove the patch earlier than 72 hours if you experience unpleasant side effects which may include dry mouth, dizziness or visual disturbances. 3. Avoid touching the patch. Wash your hands with soap and water after contact with the patch.

## 2022-07-28 NOTE — Anesthesia Postprocedure Evaluation (Signed)
Anesthesia Post Note  Patient: Tasha Valencia  Procedure(s) Performed: RIGHT CARPAL TUNNEL RELEASE (Right: Hand)     Patient location during evaluation: PACU Anesthesia Type: MAC Level of consciousness: awake and alert Pain management: pain level controlled Vital Signs Assessment: post-procedure vital signs reviewed and stable Respiratory status: spontaneous breathing Cardiovascular status: stable Anesthetic complications: no   No notable events documented.  Last Vitals:  Vitals:   07/28/22 0930 07/28/22 0939  BP: 126/82 (!) 115/91  Pulse: (!) 54 (!) 51  Resp: 15 16  Temp:  36.4 C  SpO2: 96% 99%    Last Pain:  Vitals:   07/28/22 0939  TempSrc:   PainSc: 0-No pain                 Nolon Nations

## 2022-07-28 NOTE — Transfer of Care (Signed)
Immediate Anesthesia Transfer of Care Note  Patient: Rossie Muskrat  Procedure(s) Performed: RIGHT CARPAL TUNNEL RELEASE (Right: Hand)  Patient Location: PACU  Anesthesia Type:MAC  Level of Consciousness: awake, alert , oriented and patient cooperative  Airway & Oxygen Therapy: Patient Spontanous Breathing and Patient connected to face mask oxygen  Post-op Assessment: Report given to RN and Post -op Vital signs reviewed and stable  Post vital signs: Reviewed and stable  Last Vitals:  Vitals Value Taken Time  BP    Temp    Pulse 57 07/28/22 0912  Resp 16 07/28/22 0912  SpO2 99 % 07/28/22 0912    Last Pain:  Vitals:   07/28/22 0732  TempSrc: Oral  PainSc: 0-No pain         Complications: No notable events documented.

## 2022-07-28 NOTE — Op Note (Signed)
   Date of Surgery: 07/28/2022  INDICATIONS: Patient is a 51 y.o.-year-old female with right carpal tunnel syndrome that was confirmed on electrodiagnostic studies and has failed conservative management.  Risks, benefits, and alternatives to surgery were again discussed with the patient in the preoperative area. The patient wishes to proceed with surgery.  Informed consent was signed after our discussion.   PREOPERATIVE DIAGNOSIS:  Right carpal tunnel syndrome  POSTOPERATIVE DIAGNOSIS: Same.  PROCEDURE:  Right carpal tunnel release   SURGEON: Audria Nine, M.D.  ASSIST:   ANESTHESIA:  Local, MAC  IV FLUIDS AND URINE: See anesthesia.  ESTIMATED BLOOD LOSS: <5 mL.  IMPLANTS: * No implants in log *   DRAINS: None  COMPLICATIONS: None  DESCRIPTION OF PROCEDURE: The patient was met in the preoperative holding area where the surgical site was marked and the consent form was verified.  The patient was then taken to the operating room and transferred to the operating table.  All bony prominences were well padded.  A tourniquet was applied to the right forearm.  Monitored sedation was induced.   A formal time-out was performed to confirm that this was the correct patient, surgery, side, and site. A local block was performed using 0.25% plain marcaine. The operative extremity was prepped and draped in the usual and sterile fashion.    Following a second timeout, the limb was exsanguinated and the tourniquet inflated to 250 mmHg.  A longitudinal incision was made in line with the radial border of the ring finger from distal to the wrist flexion crease to the intersection of Kaplan's cardinal line.  The skin and subcutaneous tissue was sharply divided.  The longitudinally running palmar fascia was incised.  The thenar musculature was bluntly swept off of the transverse carpal ligament.  The ligament was divided from proximal to distal until the fat surrounding the palmar arch was encountered.  Retractors were then placed in the proximal aspect of the wound to visualize the distal antebrachial fascia.  The fascia was sharply divided under direct visualization.   The wound was then thoroughly irrigated with sterile saline.  The tourniquet was deflated.  Hemostasis was achieved with direct pressure and bipolar electrocautery.  The wound was then closed with 4-0 nylon sutures in a horizontal mattress fashion. The wound was then dressed with xeroform, folded kerlix, and an ace wrap.  The patient was then reversed from anesthesia and transferred to the postoperative bed.  All counts were correct x 2 at the end of the procedure.  The patient was taken to the recovery unit in stable condition.      POSTOPERATIVE PLAN: She will be discharged to home with appropriate pain medication and discharge instructions.  I will see her back in the office in 10-14 days for her first postop visit.   Audria Nine, MD 9:11 AM

## 2022-07-28 NOTE — Interval H&P Note (Signed)
History and Physical Interval Note:  07/28/2022 8:24 AM  Tasha Valencia  has presented today for surgery, with the diagnosis of RIGHT CARPAL TUNNEL SYNDROME.  The various methods of treatment have been discussed with the patient and family. After consideration of risks, benefits and other options for treatment, the patient has consented to  Procedure(s): RIGHT CARPAL TUNNEL RELEASE (Right) as a surgical intervention.  The patient's history has been reviewed, patient examined, no change in status, stable for surgery.  I have reviewed the patient's chart and labs.  Questions were answered to the patient's satisfaction.     [name]

## 2022-07-28 NOTE — H&P (Signed)
HAND SURGERY   HPI: This is a 51 yo RHD F who presents for right carpal tunnel release.  She's had carpal tunnel symptoms on the right hand for some time now.  She recently underwent EMG/NCS on 06/17/22 which suggested moderate right CTS.  She continues to have numbness and paresthesias in the thumb, index, middle fingers.  She has nocturnal symptoms most nights per week.  She denies any new systemic symptoms or changes to her medical history.    Past Medical History:  Diagnosis Date   Fibroids    UTERINE   GERD (gastroesophageal reflux disease)    Hypertension    Pre-diabetes    Past Surgical History:  Procedure Laterality Date   ABDOMINAL HYSTERECTOMY  06/04/2019   CESAREAN SECTION  1991   CHOLECYSTECTOMY  2002   COLONOSCOPY     INTRAUTERINE DEVICE (IUD) INSERTION  2008   IUD REMOVAL N/A 08/27/2015   Procedure: INTRAUTERINE DEVICE (IUD) REMOVAL;  Surgeon: Sanjuana Kava, MD;  Location: [facility];  Service: Gynecology;  Laterality: N/A;   LAPAROSCOPIC TUBAL LIGATION Bilateral 08/27/2015   Procedure: LAPAROSCOPIC TUBAL LIGATION;  Surgeon: Sanjuana Kava, MD;  Location: [facility];  Service: Gynecology;  Laterality: Bilateral;  FILSHIE CLIPS   SUPRACERVICAL ABDOMINAL HYSTERECTOMY Bilateral 06/04/2019   Procedure: HYSTERECTOMY SUPRACERVICAL ABDOMINAL WITH BILATERAL SALPINGECTOMY;  Surgeon: Sanjuana Kava, MD;  Location: [facility];  Service: Gynecology;  Laterality: Bilateral;   Social History   Socioeconomic History   Marital status: Single    Spouse name: Not on file   Number of children: Not on file   Years of education: Not on file   Highest education level: Not on file  Occupational History   Not on file  Tobacco Use   Smoking status: Never   Smokeless tobacco: Never  Vaping Use   Vaping Use: Never used  Substance and Sexual Activity   Alcohol use: No   Drug use: No   Sexual activity: Not on file  Other Topics Concern   Not on file  Social History  Narrative   Not on file   Social Determinants of Health   Financial Resource Strain: Not on file  Food Insecurity: Not on file  Transportation Needs: Not on file  Physical Activity: Not on file  Stress: Not on file  Social Connections: Not on file   History reviewed. No pertinent family history. - negative except otherwise stated in the family history section No Known Allergies Prior to Admission medications   Medication Sig Start Date End Date Taking? Authorizing Provider  chlorthalidone (HYGROTON) 25 MG tablet TAKE 1 TABLET (25 MG TOTAL) BY MOUTH DAILY. 12/29/21 07/28/22 Yes [name], MD  Cholecalciferol (VITAMIN D-3) 25 MCG (1000 UT) CAPS Take by mouth daily.   Yes [provider]  Ferrous Sulfate (IRON) 325 (65 Fe) MG TABS Take by mouth daily.   Yes [provider]   No results found. - pertinent xrays, CT, MRI studies were reviewed and independently interpreted  Positive ROS: All other systems have been reviewed and were otherwise negative with the exception of those mentioned in the HPI and as above.  Physical Exam: General: No acute distress, resting comfortably Cardiovascular: No pedal edema Respiratory: No cyanosis, no use of accessory musculature Skin: No lesions in the area of chief complaint Neurologic: Sensation intact distally Psychiatric: Patient is at baseline mood and affect  Right hand: + Tinel, Phalen, Durkan signs. 5/5 thenar motor strength Hand warm and well  perfused    Assessment: 51 yo F presents today for R CTR.   Plan: OR today for R CTR. We again reviewed the risks of surgery including bleeding, infection, damage to NV structures, incomplete symptom relief, need for additional surgeries DC to home today from [place], M.D. [facility] 8:21 AM

## 2022-08-02 ENCOUNTER — Telehealth: Payer: Self-pay | Admitting: Orthopedic Surgery

## 2022-08-02 NOTE — Telephone Encounter (Signed)
Pt called requesting pain medication. Please send to pharmacy on file. Pt phone number is [phone]

## 2022-08-04 ENCOUNTER — Other Ambulatory Visit: Payer: Self-pay | Admitting: Physician Assistant

## 2022-08-04 ENCOUNTER — Telehealth: Payer: Self-pay | Admitting: Orthopedic Surgery

## 2022-08-04 MED ORDER — TRAMADOL HCL 50 MG PO TABS: 50.0000 mg | ORAL_TABLET | Freq: Four times a day (QID) | ORAL | 0 refills | Status: DC | PRN

## 2022-08-04 NOTE — Telephone Encounter (Signed)
Pt called about an update for pain medication. Please call pt at [phone]

## 2022-08-09 ENCOUNTER — Ambulatory Visit (INDEPENDENT_AMBULATORY_CARE_PROVIDER_SITE_OTHER): Payer: No Typology Code available for payment source | Admitting: Orthopedic Surgery

## 2022-08-09 DIAGNOSIS — G5601 Carpal tunnel syndrome, right upper limb: Secondary | ICD-10-CM

## 2022-08-09 NOTE — Progress Notes (Signed)
   Post-Op Visit Note   Patient: Tasha Valencia           Date of Birth: [birth year]           MRN: [id number] Visit Date: 08/09/2022 PCP: Patient, No Pcp Per   Assessment & Plan:  Chief Complaint:  Chief Complaint  Patient presents with   Right Hand - Routine Post Op   Visit Diagnoses:  1. Carpal tunnel syndrome, right upper limb     Plan: Patient is 2 weeks s/p R CTR.  She is doing well postoperatively.  Her incision is clean, dry, and well approximated.  The nylon sutures were removed.  She has intact thenar motor function.  Her nocturnal symptoms have resolved.  She still has some tingling in the median distribution that was present before surgery and has improved since the carpal tunnel release.  I can see her back again as needed.   Follow-Up Instructions: No follow-ups on file.   Orders:  No orders of the defined types were placed in this encounter.  No orders of the defined types were placed in this encounter.   Imaging: No results found.  PMFS History: Patient Active Problem List   Diagnosis Date Noted   Carpal tunnel syndrome, right upper limb 06/24/2022   Numbness and tingling in right hand 05/21/2022   Obstructive sleep apnea 05/15/2021   Uterine fibroid 06/04/2019   Leiomyoma of uterus, unspecified 05/31/2019   Routine general medical examination at a health care facility 06/01/2012   Contraceptive device, intrauterine 06/01/2012   Prediabetes 02/07/2012   Dyslipidemia 02/07/2012   Hypertension 02/07/2012   Vitamin D deficiency 09/16/2011   MORBID OBESITY 10/09/2008   Past Medical History:  Diagnosis Date   Fibroids    UTERINE   GERD (gastroesophageal reflux disease)    Hypertension    Pre-diabetes     No family history on file.  Past Surgical History:  Procedure Laterality Date   ABDOMINAL HYSTERECTOMY  06/04/2019   CARPAL TUNNEL RELEASE Right 07/28/2022   Procedure: RIGHT CARPAL TUNNEL RELEASE;  Surgeon: Sherilyn Cooter, MD;  Location: [facility];  Service: Orthopedics;  Laterality: Right;   CESAREAN SECTION  1991   CHOLECYSTECTOMY  2002   COLONOSCOPY     INTRAUTERINE DEVICE (IUD) INSERTION  2008   IUD REMOVAL N/A 08/27/2015   Procedure: INTRAUTERINE DEVICE (IUD) REMOVAL;  Surgeon: Sanjuana Kava, MD;  Location: [facility];  Service: Gynecology;  Laterality: N/A;   LAPAROSCOPIC TUBAL LIGATION Bilateral 08/27/2015   Procedure: LAPAROSCOPIC TUBAL LIGATION;  Surgeon: Sanjuana Kava, MD;  Location: [facility];  Service: Gynecology;  Laterality: Bilateral;  FILSHIE CLIPS   SUPRACERVICAL ABDOMINAL HYSTERECTOMY Bilateral 06/04/2019   Procedure: HYSTERECTOMY SUPRACERVICAL ABDOMINAL WITH BILATERAL SALPINGECTOMY;  Surgeon: Sanjuana Kava, MD;  Location: [facility];  Service: Gynecology;  Laterality: Bilateral;   Social History   Occupational History   Not on file  Tobacco Use   Smoking status: Never   Smokeless tobacco: Never  Vaping Use   Vaping Use: Never used  Substance and Sexual Activity   Alcohol use: No   Drug use: No   Sexual activity: Not on file

## 2022-08-19 ENCOUNTER — Telehealth: Payer: Self-pay | Admitting: Orthopedic Surgery

## 2022-08-19 NOTE — Telephone Encounter (Signed)
Pt called requesting a call back from [facility]. Pt did not leave a reason that she need a call back. Pt phone number is [phone].

## 2022-08-19 NOTE — Telephone Encounter (Signed)
I have not called patient.  

## 2022-08-20 ENCOUNTER — Telehealth: Payer: Self-pay | Admitting: Orthopedic Surgery

## 2022-08-20 NOTE — Telephone Encounter (Signed)
Note entered and placed at front for pick up. Patient aware.

## 2022-08-20 NOTE — Telephone Encounter (Signed)
I called patient and advised. She has return to work paperwork that will need to be completed. I advised she could leave for me at front desk and I will complete.  She states that her FMLA paperwork as her out of work until 09/08/2022, but her short term disability company has her out utnil 09/02/2022.  She would like to resume work 09/08/2022.  OK for this note? Resume full duty with no restrictions?

## 2022-08-20 NOTE — Telephone Encounter (Signed)
Patient states that Dr. Tempie Donning asked her to call if continued problems with her hand s/p carpal tunnel release. She feels that her incision is open a little on top and gets irritated if something touches it, so she keeps it covered.  The numbness that she had in the hand is gone, however, she still has some tingling in her fingers. She gets sore, but also has occasional really sharp pain at the incision.  She takes tylenol, aleve, and tramadol prn. She notices some relief with the aleve. Tramadol makes her sleep so she does not hurt. She wants to be sure that this is all normal and that she does not need anything further.  I advised the incision heals from the inside out and since she has no drainage or redness, this is normal. Also advised that the tingling should get better. Please advise if there is anything you would like for her to do in regards to the sharp pain around the incision, if this is normal and will get better, and  if she should continue current treatment with medication.

## 2022-08-20 NOTE — Telephone Encounter (Signed)
Patient called. Would like a call. Says she is still having trouble with her hand. Her call back number is [phone]

## 2022-08-23 NOTE — Telephone Encounter (Signed)
Patient came in and dropped off return to work form to be filled out was given to [employer].

## 2022-09-07 ENCOUNTER — Emergency Department (HOSPITAL_BASED_OUTPATIENT_CLINIC_OR_DEPARTMENT_OTHER)
Admission: EM | Admit: 2022-09-07 | Discharge: 2022-09-07 | Disposition: A | Discharge status: Home / Self Care | Payer: No Typology Code available for payment source | Attending: Emergency Medicine | Admitting: Emergency Medicine

## 2022-09-07 ENCOUNTER — Encounter (HOSPITAL_BASED_OUTPATIENT_CLINIC_OR_DEPARTMENT_OTHER): Payer: Self-pay

## 2022-09-07 DIAGNOSIS — Y92410 Unspecified street and highway as the place of occurrence of the external cause: Secondary | ICD-10-CM | POA: Diagnosis not present

## 2022-09-07 DIAGNOSIS — M542 Cervicalgia: Secondary | ICD-10-CM | POA: Insufficient documentation

## 2022-09-07 DIAGNOSIS — M549 Dorsalgia, unspecified: Secondary | ICD-10-CM | POA: Diagnosis not present

## 2022-09-07 MED ORDER — METHOCARBAMOL 500 MG PO TABS: 500.0000 mg | ORAL_TABLET | Freq: Two times a day (BID) | ORAL | 0 refills | Status: DC

## 2022-09-07 MED ORDER — NAPROXEN 375 MG PO TABS: 375.0000 mg | ORAL_TABLET | Freq: Two times a day (BID) | ORAL | 0 refills | Status: DC
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
## 2022-09-07 NOTE — Discharge Instructions (Signed)
Your exam today was reassuring.  I have sent naproxen and Robaxin into the pharmacy for you.  Naproxen is an anti-inflammatory do not take ibuprofen along with it.  Robaxin is a muscle relaxer and can make you drowsy.  Do not drive or operate heavy machinery afterwards.  Return for any concerning symptoms otherwise follow-up with your primary care provider.

## 2022-10-01 ENCOUNTER — Encounter: Payer: Self-pay | Admitting: Family Medicine

## 2022-10-01 ENCOUNTER — Ambulatory Visit (INDEPENDENT_AMBULATORY_CARE_PROVIDER_SITE_OTHER): Payer: No Typology Code available for payment source | Admitting: Family Medicine

## 2022-10-01 VITALS — BP 102/80 | HR 70 | Temp 98.0°F | Ht 62.75 in | Wt 238.3 lb

## 2022-10-01 DIAGNOSIS — M7050 Other bursitis of knee, unspecified knee: Secondary | ICD-10-CM

## 2022-10-01 DIAGNOSIS — Z23 Encounter for immunization: Secondary | ICD-10-CM | POA: Diagnosis not present

## 2022-10-01 DIAGNOSIS — I10 Essential (primary) hypertension: Secondary | ICD-10-CM | POA: Diagnosis not present

## 2022-10-01 LAB — LIPID PANEL
Cholesterol: 172 mg/dL (ref 0–200)
HDL: 43.6 mg/dL (ref >39.00)
LDL Cholesterol: 101 mg/dL — ABNORMAL HIGH (ref 0–99)
NonHDL: 128.68
Total CHOL/HDL Ratio: 4
Triglycerides: 139 mg/dL (ref 0.0–149.0)
VLDL: 27.8 mg/dL (ref 0.0–40.0)

## 2022-10-01 LAB — TSH: TSH: 0.62 u[IU]/mL (ref 0.35–5.50)

## 2022-10-01 LAB — HEMOGLOBIN A1C: Hgb A1c MFr Bld: 6 % (ref 4.6–6.5)

## 2022-10-01 MED ORDER — SEMAGLUTIDE-WEIGHT MANAGEMENT 1 MG/0.5ML ~~LOC~~ SOAJ: 1.0000 mg | SUBCUTANEOUS | 2 refills | Status: DC

## 2022-10-01 MED ORDER — SEMAGLUTIDE-WEIGHT MANAGEMENT 0.25 MG/0.5ML ~~LOC~~ SOAJ: 0.2500 mg | SUBCUTANEOUS | 0 refills | Status: AC

## 2022-10-01 MED ORDER — SEMAGLUTIDE-WEIGHT MANAGEMENT 0.5 MG/0.5ML ~~LOC~~ SOAJ: 0.5000 mg | SUBCUTANEOUS | 0 refills | Status: AC

## 2022-10-01 MED ORDER — NAPROXEN 500 MG PO TABS: 500.0000 mg | ORAL_TABLET | Freq: Two times a day (BID) | ORAL | 0 refills | Status: DC

## 2022-10-01 NOTE — Progress Notes (Signed)
New Patient Office Visit  Subjective    Patient ID: Tasha Valencia, female    DOB: 06/13/1971  Age: 51 y.o. MRN: [id number]  CC:  Chief Complaint  Patient presents with   Establish Care    HPI Tasha Valencia presents to establish care Previous PCP stopped accepting her insurance. She is reporting right knee pain for the last week, states she has been using Biofreeze spray but there is pain on the inside of the right knee, hurts when she sits for too long then stands up. States it has been going on for a while, at least a month or more.   HTN -- BP in office performed and is well controlled. She  reports no side effects to the medications, no chest pain, SOB, dizziness or headaches. She has a BP cuff at home and is checking BP regularly, reports they are in the normal range.   Obesity-- patient would like to discus weight loss, states that she has struggled with this for most of her life. States that she has tried [employer], states she has tried phentermine in the past without any success,she also reported side effects to the medication.  States that she has difficulty exercising. We had a long conversation about goal setting, we discussed the risks/benefits of the medication as well.   Outpatient Encounter Medications as of 10/01/2022  Medication Sig   CALCIUM PO Take 600 mg by mouth daily.   Cholecalciferol (VITAMIN D-3) 25 MCG (1000 UT) CAPS Take by mouth daily.   Ferrous Sulfate (IRON) 325 (65 Fe) MG TABS Take by mouth daily.   naproxen (NAPROSYN) 500 MG tablet Take 1 tablet (500 mg total) by mouth 2 (two) times daily with a meal.   POTASSIUM PO Take by mouth daily.   chlorthalidone (HYGROTON) 25 MG tablet TAKE 1 TABLET (25 MG TOTAL) BY MOUTH DAILY.   No facility-administered encounter medications on file as of 10/01/2022.    Past Medical History:  Diagnosis Date   Fibroids    UTERINE   GERD (gastroesophageal reflux disease)    Hypertension    Pre-diabetes     Past  Surgical History:  Procedure Laterality Date   ABDOMINAL HYSTERECTOMY  06/04/2019   CARPAL TUNNEL RELEASE Right 07/28/2022   Procedure: RIGHT CARPAL TUNNEL RELEASE;  Surgeon: Sherilyn Cooter, MD;  Location: [facility];  Service: Orthopedics;  Laterality: Right;   CESAREAN SECTION  1991   CHOLECYSTECTOMY  2002   COLONOSCOPY     INTRAUTERINE DEVICE (IUD) INSERTION  2008   IUD REMOVAL N/A 08/27/2015   Procedure: INTRAUTERINE DEVICE (IUD) REMOVAL;  Surgeon: Sanjuana Kava, MD;  Location: [facility];  Service: Gynecology;  Laterality: N/A;   LAPAROSCOPIC TUBAL LIGATION Bilateral 08/27/2015   Procedure: LAPAROSCOPIC TUBAL LIGATION;  Surgeon: Sanjuana Kava, MD;  Location: [facility];  Service: Gynecology;  Laterality: Bilateral;  FILSHIE CLIPS   SUPRACERVICAL ABDOMINAL HYSTERECTOMY Bilateral 06/04/2019   Procedure: HYSTERECTOMY SUPRACERVICAL ABDOMINAL WITH BILATERAL SALPINGECTOMY;  Surgeon: Sanjuana Kava, MD;  Location: [facility];  Service: Gynecology;  Laterality: Bilateral;    Family History  Problem Relation Age of Onset   Breast cancer Mother    Breast cancer Sister     Social History   Socioeconomic History   Marital status: Divorced    Spouse name: Not on file   Number of children: Not on file   Years of education: Not on file   Highest education level: Not on file  Occupational History   Not on file  Tobacco Use   Smoking status: Never   Smokeless tobacco: Never  Vaping Use   Vaping Use: Never used  Substance and Sexual Activity   Alcohol use: Yes    Comment: occassionally   Drug use: Never   Sexual activity: Yes  Other Topics Concern   Not on file  Social History Narrative   Not on file   Social Determinants of Health   Financial Resource Strain: Not on file  Food Insecurity: Not on file  Transportation Needs: Not on file  Physical Activity: Not on file  Stress: Not on file  Social Connections: Not on file  Intimate Partner  Violence: Not on file    Review of Systems  HENT:  Negative for hearing loss.   Eyes:  Negative for blurred vision.  Respiratory:  Negative for shortness of breath.   Cardiovascular:  Negative for chest pain.  Gastrointestinal: Negative.   Genitourinary: Negative.   Musculoskeletal:  Negative for back pain.  Neurological:  Negative for headaches.  Psychiatric/Behavioral:  Negative for depression.         Objective    BP 102/80 (BP Location: Left Arm, Patient Position: Sitting, Cuff Size: Large)   Pulse 70   Temp 98 F (36.7 C) (Oral)   Ht 5' 2.75" (1.594 m)   Wt 238 lb 4.8 oz (108.1 kg)   LMP 05/04/2019   SpO2 100%   BMI 42.55 kg/m   Physical Exam Vitals reviewed.  Constitutional:      Appearance: Normal appearance. She is well-groomed. She is morbidly obese.  Eyes:     Conjunctiva/sclera: Conjunctivae normal.  Neck:     Thyroid: No thyromegaly.  Cardiovascular:     Rate and Rhythm: Normal rate and regular rhythm.     Pulses: Normal pulses.     Heart sounds: S1 normal and S2 normal.  Pulmonary:     Effort: Pulmonary effort is normal.     Breath sounds: Normal breath sounds and air entry.  Abdominal:     General: Bowel sounds are normal.  Musculoskeletal:        General: Normal range of motion.     Right knee: No effusion.     Left knee: No effusion.     Right lower leg: No edema.     Left lower leg: No edema.  Skin:    General: Skin is warm and dry.  Neurological:     Mental Status: She is alert and oriented to person, place, and time. Mental status is at baseline.     Gait: Gait is intact.  Psychiatric:        Mood and Affect: Mood and affect normal.        Speech: Speech normal.        Behavior: Behavior normal.        Judgment: Judgment normal.         Assessment & Plan:   Problem List Items Addressed This Visit       Cardiovascular and Mediastinum   Hypertension    Current hypertension medications:       Sig   chlorthalidone  (HYGROTON) 25 MG tablet TAKE 1 TABLET (25 MG TOTAL) BY MOUTH DAILY.  BP is well controlled on the above medication. Will continue this as prescribed. She needs bloodwork done today including A1C and lipid panel to assess CVD risk.        Other   MORBID OBESITY    I  have had an extensive 30 minute conversation today with the patient about healthy eating habits, exercise, calorie and carb goals for sustainable and successful weight loss. I gave the patient caloric and protein daily intake values as well as described the importance of increasing fiber and water intake. I discussed weight loss medications that could be used in the treatment of this patient. Handouts on low carb eating were given to the patient. Will start her on semaglutide injections once weekly if approved through her insurance. I will see her back in 3 months for follow up weight check.      Relevant Medications   Semaglutide-Weight Management 0.25 MG/0.5ML SOAJ   Semaglutide-Weight Management 0.5 MG/0.5ML SOAJ (Start on 10/30/2022)   Semaglutide-Weight Management 1 MG/0.5ML SOAJ (Start on 11/28/2022)   Other Relevant Orders   Lipid Panel (Completed)   TSH (Completed)   Hemoglobin A1c (Completed)   Other Visit Diagnoses     Pes anserine bursitis    -  Primary   Relevant Medications   naproxen (NAPROSYN) 500 MG tablet  there is tenderness to palpation of the medial knee on the right. This is the most likely diagnosis, I recommend using NSAIDS BID for the pain. Weight loss iwll also help with knee pain over the long term.   Immunization due       Relevant Orders   Flu Vaccine QUAD 6+ mos PF IM (Fluarix Quad PF) (Completed)       Return in about 3 months (around 01/01/2023).   Farrel Conners, MD

## 2022-10-01 NOTE — Patient Instructions (Addendum)
Call you insurance company, find out if they will cover [employer] or Saxenda-- if they say it needs a prior auth -- ask them what requirements there are to get the prior auth.  Total protein intake- 90 gram per day  Total fiber intake - 25-30 grams   Total calorie intake- 2200 (base rate) Try to shoot for 1800-1900 per day

## 2022-10-04 NOTE — Assessment & Plan Note (Addendum)
Current hypertension medications:      Sig   chlorthalidone (HYGROTON) 25 MG tablet TAKE 1 TABLET (25 MG TOTAL) BY MOUTH DAILY.    BP is well controlled on the above medication. Will continue this as prescribed. She needs bloodwork done today including CMP and lipid panel to assess CVD risk.

## 2022-10-04 NOTE — Progress Notes (Signed)
Labs normal except her A1C does show prediabetes, I recommend reducing sugar and starches in her diet and increasing exercise to 30 minutes daily. We will recheck her A1C at the next visit.

## 2022-10-04 NOTE — Assessment & Plan Note (Signed)
I have had an extensive 30 minute conversation today with the patient about healthy eating habits, exercise, calorie and carb goals for sustainable and successful weight loss. I gave the patient caloric and protein daily intake values as well as described the importance of increasing fiber and water intake. I discussed weight loss medications that could be used in the treatment of this patient. Handouts on low carb eating were given to the patient. Will start her on semaglutide injections once weekly if approved through her insurance. I will see her back in 3 months for follow up weight check.

## 2022-10-05 ENCOUNTER — Encounter: Payer: Self-pay | Admitting: Family Medicine

## 2022-10-05 ENCOUNTER — Telehealth: Payer: Self-pay | Admitting: Family Medicine

## 2022-10-05 NOTE — Telephone Encounter (Signed)
Fax received from [facility] requesting a prior auth for [facility] 0.'25mg'$ .  PA was sent to [email or web address]-(Key: [id number]) pending review by the patient's insurance.  Message also sent via Mychart message.

## 2022-10-05 NOTE — Telephone Encounter (Signed)
Pt call and stated her Insurance co stated  they need a PA for  [facility] and Saxenda and the phone # is [phone] and you can go to [email or web address]

## 2022-10-05 NOTE — Telephone Encounter (Signed)
I already sent the prescriptions into the pharmacy-- who normally gets the PA after I send the meds?

## 2022-10-12 ENCOUNTER — Ambulatory Visit: Payer: No Typology Code available for payment source | Admitting: Family Medicine

## 2022-11-01 ENCOUNTER — Encounter: Payer: No Typology Code available for payment source | Admitting: Orthopedic Surgery

## 2022-11-13 IMAGING — CT CT CARDIAC CORONARY ARTERY CALCIUM SCORE
3 series · 14 of 20 positions shown, 16 images · non-contrast
Comparison: None.
COMPARISON: None.

Addendum:
EXAM:
OVER-READ INTERPRETATION  CT CHEST

The following report is an over-read performed by radiologist Dr.
Johari Anand [[email or web address]] on 02/16/2022. This
over-read does not include interpretation of cardiac or coronary
anatomy or pathology. The coronary calcium score interpretation by
the cardiologist is attached.
CLINICAL DATA: Cardiovascular Disease Risk stratification
Coronary Calcium Score
TECHNIQUE: A gated, non-contrast computed tomography scan of the heart was
performed using 3mm slice thickness. Axial images were analyzed on a
dedicated workstation. Calcium scoring of the coronary arteries was
performed using the Agatston method.

[Series 2: cascseq 2.0 sa36 70% (id) · axial · 0.39mm/px · z∈[-209,-139]mm · 4 of 59 slices shown]
[im 12/59  vessel]
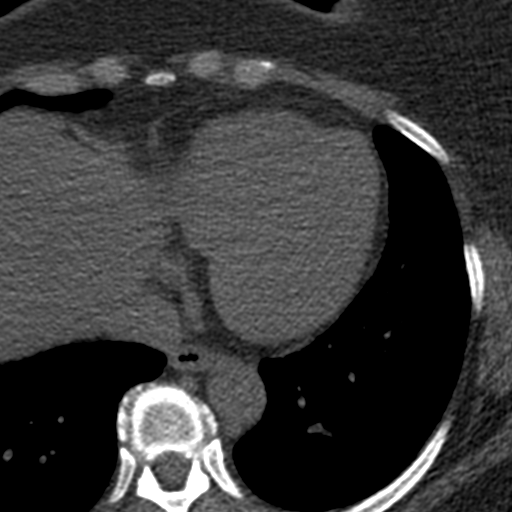
[im 24/59  vessel]
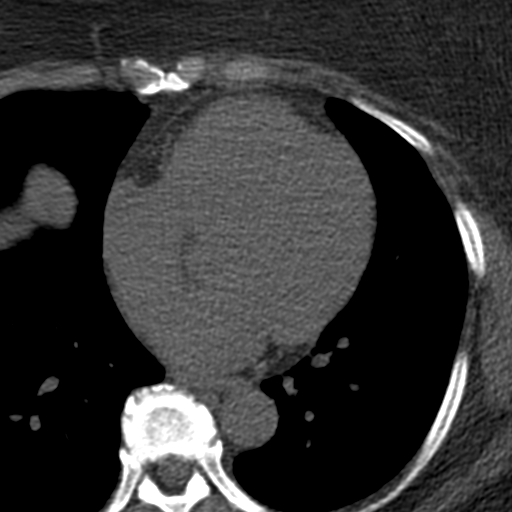
[im 35/59  vessel]
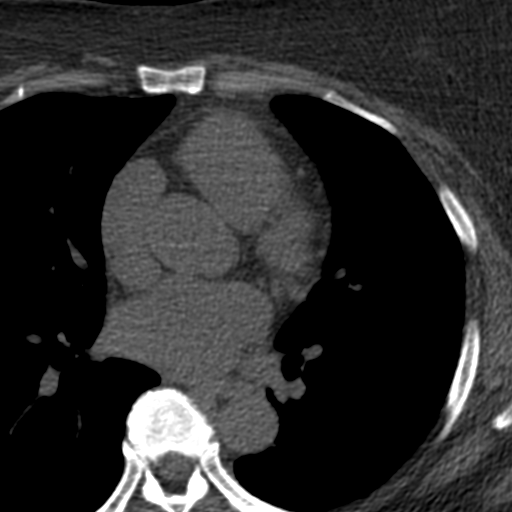
[im 47/59  vessel]
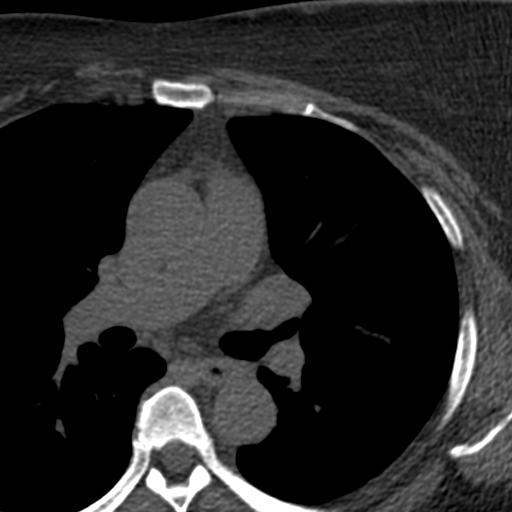

[Series 3: cascseq 2.0 bf37 st · axial · 0.66mm/px · z∈[-213,-135]mm · 5 of 59 slices shown, 7 images]
[im 10/59  vessel]
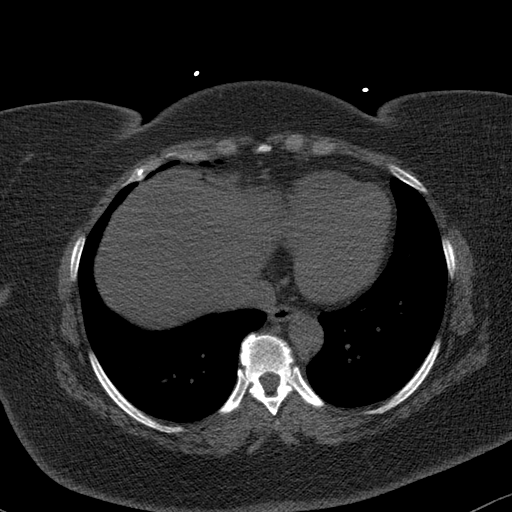
[im 10/59  lung]
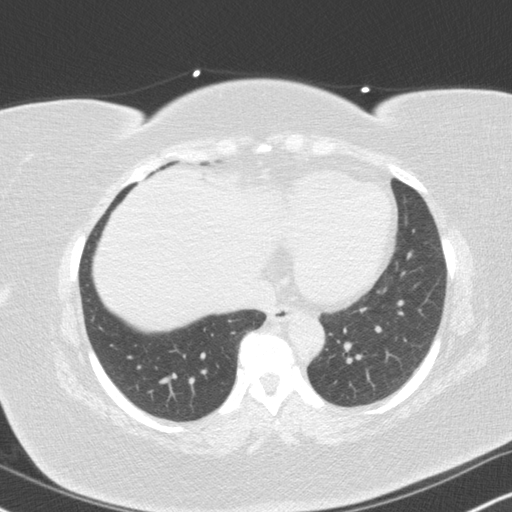
[im 20/59  vessel]
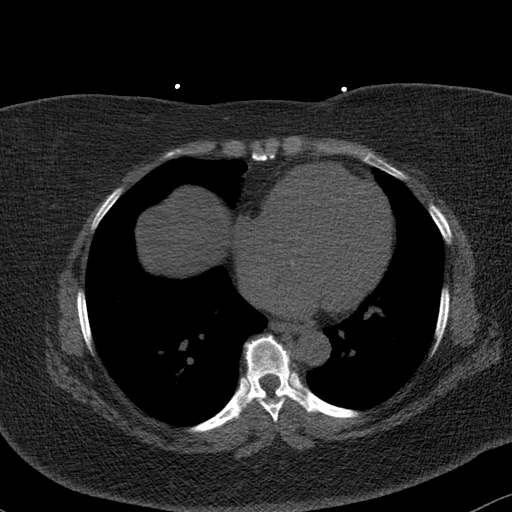
[im 30/59  vessel]
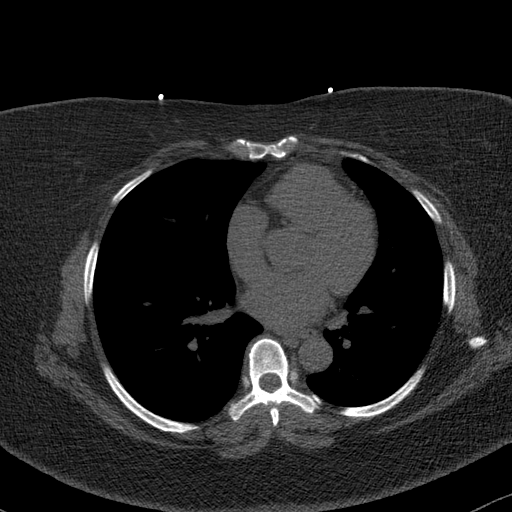
[im 39/59  vessel]
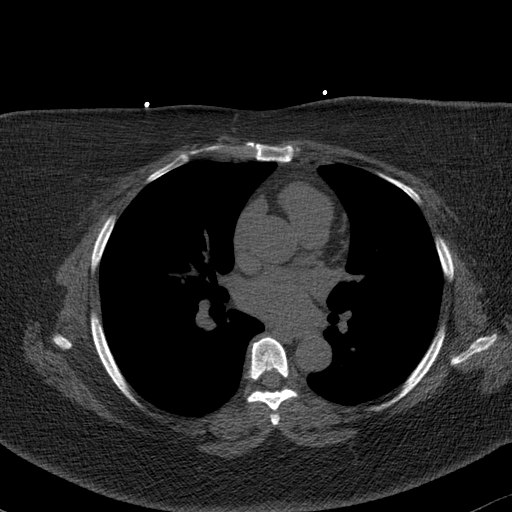
[im 49/59  vessel]
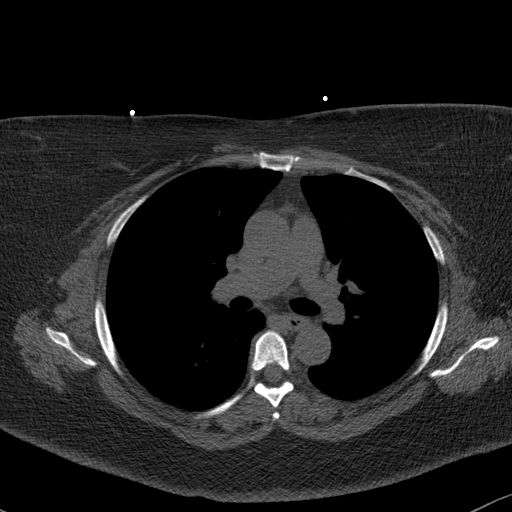
[im 49/59  lung]
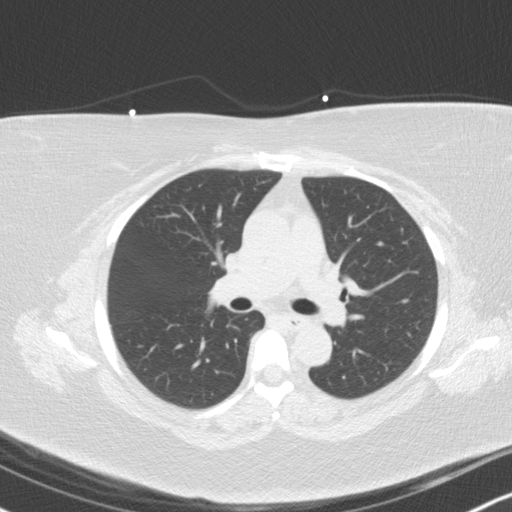

[Series 4: cascseq 2.0 br59 lung · axial · 0.66mm/px · z∈[-213,-135]mm · 5 of 59 slices shown]
[im 10/59  lung]
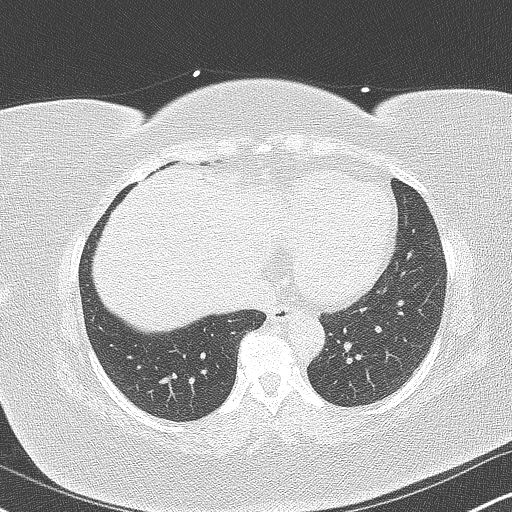
[im 20/59  lung]
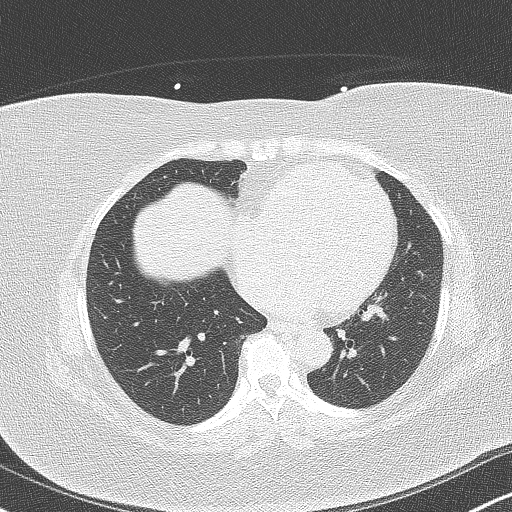
[im 30/59  lung]
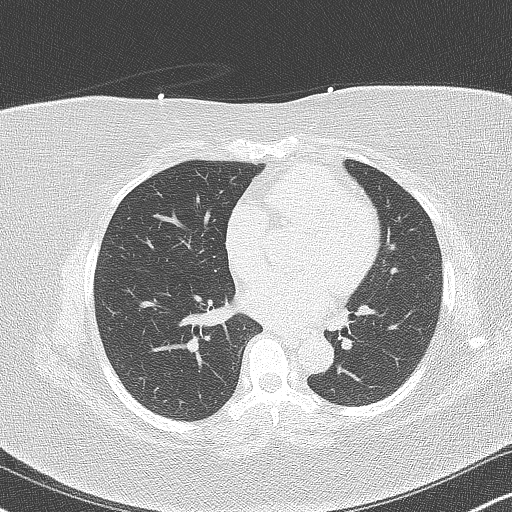
[im 39/59  lung]
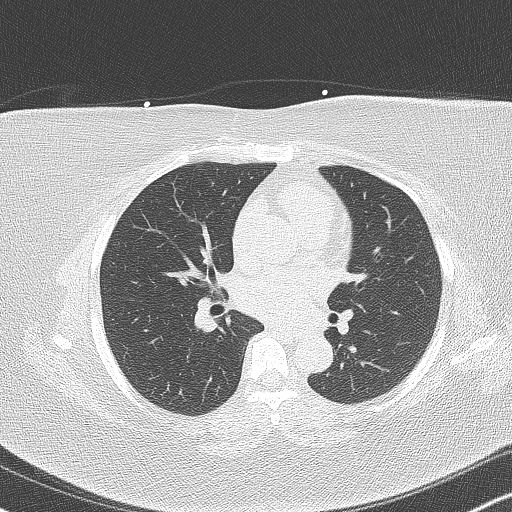
[im 49/59  lung]
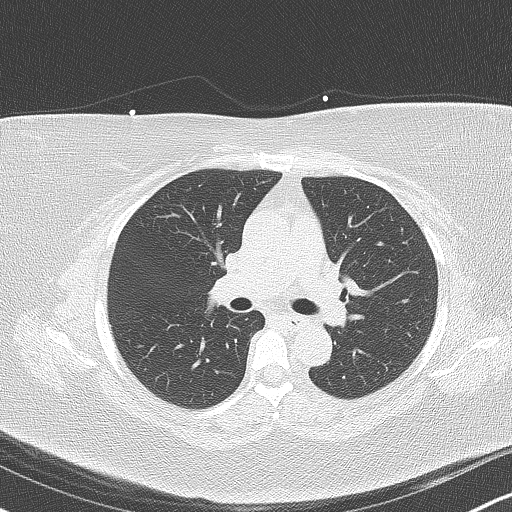

[14 of 20 positions shown; findings below may reference images not displayed]

FINDINGS: Atherosclerotic calcifications in the thoracic aorta. Within the
visualized portions of the thorax there are no suspicious appearing
pulmonary nodules or masses, there is no acute consolidative
airspace disease, no pleural effusions, no pneumothorax and no
lymphadenopathy. Visualized portions of the upper abdomen
demonstrates low attenuation throughout the visualized hepatic
parenchyma, indicative of a background of hepatic steatosis. There
are no aggressive appearing lytic or blastic lesions noted in the
visualized portions of the skeleton.
IMPRESSION: 1.  Aortic Atherosclerosis ([id number].H).
2. Hepatic steatosis.
FINDINGS: Coronary Calcium Score:

Left main: 0

Left anterior descending artery: 0

Left circumflex artery: 0

Right coronary artery: 0

Total: 0

Percentile: NA

Pericardium: Normal.

Non-cardiac: See separate report from [REDACTED].
IMPRESSION: Coronary calcium score of 0.



If CAC=0, it is reasonable to withhold statin therapy and reassess
in 5 to 10 years, as long as higher risk conditions are absent
(diabetes mellitus, family history of premature CHD in first degree
relatives (males <55 years; females <65 years), cigarette smoking,
or LDL >=190 mg/dL).

If CAC is 1 to 99, it is reasonable to initiate statin therapy for
patients >=55 years of age.

If CAC is >=100 or >=75th percentile, it is reasonable to initiate
statin therapy at any age.

Cardiology referral should be considered for patients with CAC
scores >=400 or >=75th percentile.

*1446 AHA/ACC/AACVPR/AAPA/ABC/ELISABET/[place]/CEPOI/Evia/GUIDO/JIM/DULIENE
Guideline on the Management of Blood Cholesterol: A Report of the
American College of Cardiology/American Heart Association Task Force
on Clinical Practice Guidelines. J Am Coll Cardiol.
5107;73(24):7584-7637.

*** End of Addendum ***
EXAM:
OVER-READ INTERPRETATION  CT CHEST

The following report is an over-read performed by radiologist Dr.
Johari Anand [REDACTED] on 02/16/2022. This
over-read does not include interpretation of cardiac or coronary
anatomy or pathology. The coronary calcium score interpretation by
the cardiologist is attached.
FINDINGS: Atherosclerotic calcifications in the thoracic aorta. Within the
visualized portions of the thorax there are no suspicious appearing
pulmonary nodules or masses, there is no acute consolidative
airspace disease, no pleural effusions, no pneumothorax and no
lymphadenopathy. Visualized portions of the upper abdomen
demonstrates low attenuation throughout the visualized hepatic
parenchyma, indicative of a background of hepatic steatosis. There
are no aggressive appearing lytic or blastic lesions noted in the
visualized portions of the skeleton.
IMPRESSION: 1.  Aortic Atherosclerosis (Y2M6Y-K6H.H).
2. Hepatic steatosis.

## 2022-11-25 ENCOUNTER — Encounter: Payer: Self-pay | Admitting: Family Medicine

## 2022-11-25 DIAGNOSIS — M25561 Pain in right knee: Secondary | ICD-10-CM

## 2022-11-26 ENCOUNTER — Ambulatory Visit (INDEPENDENT_AMBULATORY_CARE_PROVIDER_SITE_OTHER): Payer: No Typology Code available for payment source

## 2022-11-26 ENCOUNTER — Other Ambulatory Visit: Payer: No Typology Code available for payment source

## 2022-11-26 DIAGNOSIS — M25561 Pain in right knee: Secondary | ICD-10-CM | POA: Diagnosis not present

## 2022-11-29 NOTE — Progress Notes (Signed)
X-ray of knee appears normal, would she like a referral to orthopedics?

## 2022-11-30 ENCOUNTER — Telehealth: Payer: Self-pay | Admitting: Family Medicine

## 2022-11-30 NOTE — Telephone Encounter (Signed)
See results note. 

## 2022-11-30 NOTE — Telephone Encounter (Signed)
Pt is return Tasha Valencia call concerning xray result

## 2022-12-31 ENCOUNTER — Encounter: Payer: Self-pay | Admitting: Family Medicine

## 2022-12-31 ENCOUNTER — Ambulatory Visit (INDEPENDENT_AMBULATORY_CARE_PROVIDER_SITE_OTHER): Payer: No Typology Code available for payment source | Admitting: Family Medicine

## 2022-12-31 VITALS — BP 128/88 | HR 80 | Temp 97.9°F | Ht 62.75 in | Wt 247.5 lb

## 2022-12-31 DIAGNOSIS — M25562 Pain in left knee: Secondary | ICD-10-CM | POA: Diagnosis not present

## 2022-12-31 DIAGNOSIS — M25561 Pain in right knee: Secondary | ICD-10-CM

## 2022-12-31 DIAGNOSIS — G8929 Other chronic pain: Secondary | ICD-10-CM | POA: Diagnosis not present

## 2022-12-31 MED ORDER — TOPIRAMATE 25 MG PO TABS: 25.0000 mg | ORAL_TABLET | Freq: Every day | ORAL | 2 refills | Status: DC

## 2022-12-31 MED ORDER — PHENTERMINE HCL 15 MG PO CAPS: 15.0000 mg | ORAL_CAPSULE | ORAL | 0 refills | Status: DC

## 2022-12-31 NOTE — Patient Instructions (Addendum)
Carb goal: less than 90 gram per day  Protein: at least 100 grams per day  Calories: 1800 per day

## 2022-12-31 NOTE — Progress Notes (Unsigned)
Established Patient Office Visit  Subjective   Patient ID: Tasha Valencia, female    DOB: 06/13/1971  Age: 52 y.o. MRN: [id number]  Chief Complaint  Patient presents with   Follow-up    Patient is here for weight loss management and follow up. Her insurance denied the wegovy. We discussed other medical options for treatment, including phentermine and topiramate. We discussed the risks/benefits of these medications and she is agreeable to proceed with these medications.   We discussed dietary changes and I gave her goals and food list to follow.  Pt reports that her knee pain continues to persist/ worsen, states that it is interfering with her ability to exercise to help with the weight loss. She has been using the naproxen but it is now progressing to her other knee as well. We discussed further steps and I recommended referral to sport medicine. Pt is agreeable to this.    Current Outpatient Medications  Medication Instructions   CALCIUM PO 600 mg, Oral, Daily   chlorthalidone (HYGROTON) 25 mg, Oral, Daily   Cholecalciferol (VITAMIN D-3) 25 MCG (1000 UT) CAPS Oral, Daily   Ferrous Sulfate (IRON) 325 (65 Fe) MG TABS Oral, Daily   phentermine 15 mg, Oral, BH-each morning   POTASSIUM PO Oral, Daily   topiramate (TOPAMAX) 25 mg, Oral, Daily at bedtime    Patient Active Problem List   Diagnosis Date Noted   Carpal tunnel syndrome, right upper limb 06/24/2022   Numbness and tingling in right hand 05/21/2022   Obstructive sleep apnea 05/15/2021   Uterine fibroid 06/04/2019   Leiomyoma of uterus, unspecified 05/31/2019   Routine general medical examination at a health care facility 06/01/2012   Contraceptive device, intrauterine 06/01/2012   Prediabetes 02/07/2012   Dyslipidemia 02/07/2012   Hypertension 02/07/2012   Vitamin D deficiency 09/16/2011   MORBID OBESITY 10/09/2008      Review of Systems  All other systems reviewed and are negative.     Objective:     BP  128/88 (BP Location: Left Arm, Patient Position: Sitting, Cuff Size: Large)   Pulse 80   Temp 97.9 F (36.6 C) (Oral)   Ht 5' 2.75" (1.594 m)   Wt 247 lb 8 oz (112.3 kg)   LMP 05/04/2019   SpO2 99%   BMI 44.19 kg/m  BP Readings from Last 3 Encounters:  12/31/22 128/88  10/01/22 102/80  09/07/22 (!) 141/98   Wt Readings from Last 3 Encounters:  12/31/22 247 lb 8 oz (112.3 kg)  10/01/22 238 lb 4.8 oz (108.1 kg)  09/07/22 236 lb (107 kg)      Physical Exam Vitals reviewed.  Constitutional:      Appearance: Normal appearance. She is well-groomed. She is morbidly obese.  Eyes:     Conjunctiva/sclera: Conjunctivae normal.  Neck:     Thyroid: No thyromegaly.  Cardiovascular:     Rate and Rhythm: Normal rate and regular rhythm.     Pulses: Normal pulses.     Heart sounds: S1 normal and S2 normal.  Pulmonary:     Effort: Pulmonary effort is normal.     Breath sounds: Normal breath sounds and air entry.  Musculoskeletal:        General: Normal range of motion.  Skin:    General: Skin is warm and dry.  Neurological:     Mental Status: She is alert and oriented to person, place, and time. Mental status is at baseline.     Gait: Gait is intact.  Psychiatric:        Mood and Affect: Mood and affect normal.        Speech: Speech normal.        Behavior: Behavior normal.        Judgment: Judgment normal.      No results found for any visits on 12/31/22.    The 10-year ASCVD risk score (Arnett DK, et al., 2019) is: 4.6%    Assessment & Plan:   Problem List Items Addressed This Visit       Unprioritized   MORBID OBESITY   Relevant Medications   Wegovy was not approved for her by the insurance company, will start phentermine 15 mg capsules once daily in the AM and topiramate 25 mg daily at bedtime for appetite reduction. I will see her back in 1 month for a weight check.  phentermine 15 MG capsule   topiramate (TOPAMAX) 25 MG tablet   Other Visit Diagnoses      Chronic pain of both knees    -  Primary   Relevant Medications   Patient is being referred to sports medicine, failed NSAID therapy and her pain is progressing into the other knee as well.    Other Relevant Orders   Ambulatory referral to Sports Medicine       Return in about 1 month (around 01/31/2023) for Follow up Weight Management.    Farrel Conners, MD

## 2023-01-05 NOTE — Progress Notes (Unsigned)
    Benito Mccreedy D.Percival Woodridge Phone: [phone]   Assessment and Plan:     There are no diagnoses linked to this encounter.  ***   Pertinent previous records reviewed include ***   Follow Up: ***     Subjective:   I, Neill Jurewicz, am serving as a Education administrator for Doctor Glennon Mac  Chief Complaint: bilateral knee and right thumb   HPI:   01/06/2023 Patient is a 51 year old female complaining of bilateral knee and thumb pain. Patient states   Relevant Historical Information: ***  Additional pertinent review of systems negative.   Current Outpatient Medications:    CALCIUM PO, Take 600 mg by mouth daily., Disp: , Rfl:    chlorthalidone (HYGROTON) 25 MG tablet, TAKE 1 TABLET (25 MG TOTAL) BY MOUTH DAILY., Disp: 90 tablet, Rfl: 3   Cholecalciferol (VITAMIN D-3) 25 MCG (1000 UT) CAPS, Take by mouth daily., Disp: , Rfl:    Ferrous Sulfate (IRON) 325 (65 Fe) MG TABS, Take by mouth daily., Disp: , Rfl:    phentermine 15 MG capsule, Take 1 capsule (15 mg total) by mouth every morning., Disp: 30 capsule, Rfl: 0   POTASSIUM PO, Take by mouth daily., Disp: , Rfl:    topiramate (TOPAMAX) 25 MG tablet, Take 1 tablet (25 mg total) by mouth at bedtime., Disp: 30 tablet, Rfl: 2   Objective:     There were no vitals filed for this visit.    There is no height or weight on file to calculate BMI.    Physical Exam:    ***   Electronically signed by:  Benito Mccreedy D.[facility] 4:25 PM 01/05/23

## 2023-01-06 ENCOUNTER — Ambulatory Visit: Payer: No Typology Code available for payment source

## 2023-01-06 ENCOUNTER — Ambulatory Visit (INDEPENDENT_AMBULATORY_CARE_PROVIDER_SITE_OTHER): Payer: No Typology Code available for payment source | Admitting: Sports Medicine

## 2023-01-06 VITALS — HR 83 | Ht 62.0 in | Wt 247.0 lb

## 2023-01-06 DIAGNOSIS — M25561 Pain in right knee: Secondary | ICD-10-CM | POA: Diagnosis not present

## 2023-01-06 DIAGNOSIS — M25562 Pain in left knee: Secondary | ICD-10-CM

## 2023-01-06 DIAGNOSIS — M79641 Pain in right hand: Secondary | ICD-10-CM

## 2023-01-06 DIAGNOSIS — M65311 Trigger thumb, right thumb: Secondary | ICD-10-CM

## 2023-01-06 DIAGNOSIS — M222X1 Patellofemoral disorders, right knee: Secondary | ICD-10-CM

## 2023-01-06 DIAGNOSIS — M222X2 Patellofemoral disorders, left knee: Secondary | ICD-10-CM

## 2023-01-06 DIAGNOSIS — G8929 Other chronic pain: Secondary | ICD-10-CM

## 2023-01-06 MED ORDER — MELOXICAM 15 MG PO TABS: 15.0000 mg | ORAL_TABLET | Freq: Every day | ORAL | 0 refills | Status: DC

## 2023-01-06 NOTE — Patient Instructions (Addendum)
Good to see you  - Start meloxicam 15 mg daily x2 weeks.  If still having pain after 2 weeks, complete 3rd-week of meloxicam. May use remaining meloxicam as needed once daily for pain control.  Do not to use additional NSAIDs while taking meloxicam.  May use Tylenol [phone] mg 2 to 3 times a day for breakthrough pain. Knee and thumb HEP  Recommend getting a thumb brace to wear at night  4 week follow up

## 2023-01-25 ENCOUNTER — Other Ambulatory Visit: Payer: Self-pay | Admitting: Cardiovascular Disease

## 2023-02-01 ENCOUNTER — Encounter: Payer: Self-pay | Admitting: Family Medicine

## 2023-02-01 ENCOUNTER — Ambulatory Visit: Payer: No Typology Code available for payment source | Admitting: Family Medicine

## 2023-02-01 DIAGNOSIS — R7303 Prediabetes: Secondary | ICD-10-CM

## 2023-02-01 LAB — POCT GLYCOSYLATED HEMOGLOBIN (HGB A1C): Hemoglobin A1C: 5.4 % (ref 4.0–5.6)

## 2023-02-01 MED ORDER — PHENTERMINE HCL 30 MG PO CAPS: 30.0000 mg | ORAL_CAPSULE | ORAL | 0 refills | Status: DC

## 2023-02-01 MED ORDER — TOPIRAMATE 50 MG PO TABS: 50.0000 mg | ORAL_TABLET | Freq: Every day | ORAL | 2 refills | Status: DC

## 2023-02-01 NOTE — Assessment & Plan Note (Signed)
A1C performed in office today and is down to 5.4, pt reports she is reducing sugar and starches in her diet which has helped reduce this number.

## 2023-02-01 NOTE — Progress Notes (Signed)
Established Patient Office Visit  Subjective   Patient ID: Tasha Valencia, female    DOB: [birth year]  Age: 52 y.o. MRN: [id number]  Chief Complaint  Patient presents with   Medical Management of Chronic Issues    Pt is here for follow up on her prediabetes and weight loss. Patient reports that she is feeling good, states that the phentermine is working relatively well for her, states that the longer she took it she felt like the medication wasn't as strong as when she first started it. States that she is taking the topiramate at night.   States that she is working more on her meal prep. States that she is watching a lot of videos on how to prepare her foods better. A1C performed today and is 5.4 ,much improved with the dietary changes that were given.   Knee pain is resolved, she did see the sports medicine physician and she is planning to get and injection in her thumb for the tendonitis.  We discussed the possibility of sending her for a sleep study, given that she continues to have difficulty with getting restful sleep. Patient wants to wait and see how it goes with her weight loss first.   Current Outpatient Medications  Medication Instructions   CALCIUM PO 600 mg, Oral, Daily   chlorthalidone (HYGROTON) 25 mg, Oral, Daily   Cholecalciferol (VITAMIN D-3) 25 MCG (1000 UT) CAPS Oral, Daily   Ferrous Sulfate (IRON) 325 (65 Fe) MG TABS Oral, Daily   meloxicam (MOBIC) 15 mg, Oral, Daily   phentermine 30 mg, Oral, BH-each morning   POTASSIUM PO Oral, Daily   topiramate (TOPAMAX) 50 mg, Oral, Daily at bedtime    Patient Active Problem List   Diagnosis Date Noted   Carpal tunnel syndrome, right upper limb 06/24/2022   Numbness and tingling in right hand 05/21/2022   Obstructive sleep apnea 05/15/2021   Uterine fibroid 06/04/2019   Leiomyoma of uterus, unspecified 05/31/2019   Routine general medical examination at a health care facility 06/01/2012   Contraceptive device,  intrauterine 06/01/2012   Prediabetes 02/07/2012   Dyslipidemia 02/07/2012   Hypertension 02/07/2012   Vitamin D deficiency 09/16/2011   MORBID OBESITY 10/09/2008      Review of Systems  All other systems reviewed and are negative.     Objective:     BP 112/80 (BP Location: Left Arm, Patient Position: Sitting, Cuff Size: Large)   Pulse 80   Temp 97.8 F (36.6 C) (Oral)   Ht '5\' 2"'$  (1.575 m)   Wt 239 lb 11.2 oz (108.7 kg)   LMP 05/04/2019   SpO2 98%   BMI 43.84 kg/m    Physical Exam Vitals reviewed.  Constitutional:      Appearance: Normal appearance. She is well-groomed. She is morbidly obese.  Cardiovascular:     Rate and Rhythm: Normal rate and regular rhythm.     Pulses: Normal pulses.     Heart sounds: S1 normal and S2 normal.  Pulmonary:     Effort: Pulmonary effort is normal.     Breath sounds: Normal breath sounds and air entry.  Neurological:     Mental Status: She is alert and oriented to person, place, and time. Mental status is at baseline.     Gait: Gait is intact.  Psychiatric:        Mood and Affect: Mood and affect normal.        Speech: Speech normal.  Behavior: Behavior normal.        Judgment: Judgment normal.      Results for orders placed or performed in visit on 02/01/23  POC HgB A1c  Result Value Ref Range   Hemoglobin A1C 5.4 4.0 - 5.6 %   HbA1c POC (<> result, manual entry)     HbA1c, POC (prediabetic range)     HbA1c, POC (controlled diabetic range)        The 10-year ASCVD risk score (Arnett DK, et al., 2019) is: 2.8%    Assessment & Plan:   Problem List Items Addressed This Visit       Unprioritized   MORBID OBESITY - Primary    8 pound weight loss in the last month with the phentermine 15 mg capsule daily and the topiramate 25 mg at bedtime. Pt reports that the medications is starting to wear off towards the end of the month. We discussed increasing both the topiramate and the phentermine and she is agreeable.  This the patient's second month of her weight loss program. We are using a combination of calorie and carb restriction, plus increasing walking to at least 30 minutes daily. The goals I gave her previously are listed below:  Total protein intake- 90 gram per day  Total carb intake: less than 90 grams per day   Total fiber intake - 25-30 grams    Total calorie intake- 2200 (base rate) Try to shoot for 1800-1900 per day       Relevant Medications   topiramate (TOPAMAX) 50 MG tablet   phentermine 30 MG capsule   Prediabetes    A1C performed in office today and is down to 5.4, pt reports she is reducing sugar and starches in her diet which has helped reduce this number.       Relevant Orders   POC HgB A1c (Completed)    Return in about 1 month (around 03/02/2023) for weight loss.    Farrel Conners, MD

## 2023-02-01 NOTE — Assessment & Plan Note (Addendum)
8 pound weight loss in the last month with the phentermine 15 mg capsule daily and the topiramate 25 mg at bedtime. Pt reports that the medications is starting to wear off towards the end of the month. We discussed increasing both the topiramate and the phentermine and she is agreeable. This the patient's second month of her weight loss program. We are using a combination of calorie and carb restriction, plus increasing walking to at least 30 minutes daily. The goals I gave her previously are listed below:  Total protein intake- 90 gram per day  Total carb intake: less than 90 grams per day   Total fiber intake - 25-30 grams    Total calorie intake- 2200 (base rate) Try to shoot for 1800-1900 per day

## 2023-02-03 ENCOUNTER — Other Ambulatory Visit: Payer: Self-pay | Admitting: Sports Medicine

## 2023-02-11 ENCOUNTER — Ambulatory Visit: Payer: No Typology Code available for payment source | Admitting: Sports Medicine

## 2023-02-11 NOTE — Progress Notes (Deleted)
    Benito Mccreedy D.Gibsonia Orosi Phone: [phone]   Assessment and Plan:     There are no diagnoses linked to this encounter.  ***   Pertinent previous records reviewed include ***   Follow Up: ***     Subjective:   I, Tasha Valencia, am serving as a Education administrator for Doctor Glennon Mac   Chief Complaint: bilateral knee and right thumb    HPI:    01/06/2023 Patient is a 52 year old female complaining of bilateral knee and thumb pain. Patient states knee pain continues to persist/ worsen, states that it is interfering with her ability to exercise to help with the weight loss. She has been using the naproxen but it is now progressing to her other knee as well ,take motrin and advil plus I've tried biofreeze and icy hot. Anterior knee pain when standing and walking mostly at night    Right thumb feels like it gets our of joint , sometimes at night she has pain that radiates through the thumb hx of carpal tunnel surgery August ,    No numbness or tingling, was using naproxen and that didn't help , creams helped more pain has decreased since October    02/14/2023 Patient states   Relevant Historical Information: Hypertension, elevated BMI  Additional pertinent review of systems negative.   Current Outpatient Medications:    CALCIUM PO, Take 600 mg by mouth daily., Disp: , Rfl:    chlorthalidone (HYGROTON) 25 MG tablet, Take 1 tablet (25 mg total) by mouth daily., Disp: 90 tablet, Rfl: 3   Cholecalciferol (VITAMIN D-3) 25 MCG (1000 UT) CAPS, Take by mouth daily., Disp: , Rfl:    Ferrous Sulfate (IRON) 325 (65 Fe) MG TABS, Take by mouth daily., Disp: , Rfl:    meloxicam (MOBIC) 15 MG tablet, Take 1 tablet (15 mg total) by mouth daily., Disp: 30 tablet, Rfl: 0   phentermine 30 MG capsule, Take 1 capsule (30 mg total) by mouth every morning., Disp: 30 capsule, Rfl: 0   POTASSIUM PO, Take by mouth daily., Disp: ,  Rfl:    topiramate (TOPAMAX) 50 MG tablet, Take 1 tablet (50 mg total) by mouth at bedtime., Disp: 30 tablet, Rfl: 2   Objective:     There were no vitals filed for this visit.    There is no height or weight on file to calculate BMI.    Physical Exam:    ***   Electronically signed by:  Benito Mccreedy D.[facility] 7:36 AM 02/11/23

## 2023-02-14 ENCOUNTER — Ambulatory Visit: Payer: No Typology Code available for payment source | Admitting: Sports Medicine

## 2023-02-14 ENCOUNTER — Ambulatory Visit: Payer: No Typology Code available for payment source | Admitting: Family Medicine

## 2023-03-03 ENCOUNTER — Ambulatory Visit: Payer: No Typology Code available for payment source | Admitting: Family Medicine

## 2023-03-14 ENCOUNTER — Encounter: Payer: Self-pay | Admitting: Family Medicine

## 2023-03-14 ENCOUNTER — Ambulatory Visit (INDEPENDENT_AMBULATORY_CARE_PROVIDER_SITE_OTHER): Payer: No Typology Code available for payment source | Admitting: Family Medicine

## 2023-03-14 DIAGNOSIS — H6123 Impacted cerumen, bilateral: Secondary | ICD-10-CM | POA: Diagnosis not present

## 2023-03-14 MED ORDER — SAXENDA 18 MG/3ML ~~LOC~~ SOPN: PEN_INJECTOR | SUBCUTANEOUS | 5 refills | Status: DC

## 2023-03-14 NOTE — Progress Notes (Unsigned)
Established Patient Office Visit  Subjective   Patient ID: Tasha Valencia, female    DOB: [birth year]  Age: 52 y.o. MRN: [id number]  Chief Complaint  Patient presents with   Medical Management of Chronic Issues    Pt is here for follow up today. She reports that the phentermine 30 mg capsules are not working, states that they make her feel funny. We had a long conversation about the GLP medications, will start with Saxenda. The patient has been seeing me for obesity management and weight loss since 10/01/2022. We have tried topiramate and phentermine without any meaningful weight loss.    Current Outpatient Medications  Medication Instructions   CALCIUM PO 600 mg, Oral, Daily   chlorthalidone (HYGROTON) 25 mg, Oral, Daily   Cholecalciferol (VITAMIN D-3) 25 MCG (1000 UT) CAPS Oral, Daily   Ferrous Sulfate (IRON) 325 (65 Fe) MG TABS Oral, Daily   Liraglutide -Weight Management (SAXENDA) 18 MG/3ML SOPN Inject 0.6 mg into the skin daily for 7 days, THEN 1.2 mg daily for 7 days, THEN 1.8 mg daily for 7 days, THEN 2.4 mg daily for 7 days, THEN 3 mg daily.   POTASSIUM PO Oral, Daily   topiramate (TOPAMAX) 50 mg, Oral, Daily at bedtime    Patient Active Problem List   Diagnosis Date Noted   Carpal tunnel syndrome, right upper limb 06/24/2022   Numbness and tingling in right hand 05/21/2022   Obstructive sleep apnea 05/15/2021   Uterine fibroid 06/04/2019   Leiomyoma of uterus, unspecified 05/31/2019   Routine general medical examination at a health care facility 06/01/2012   Contraceptive device, intrauterine 06/01/2012   Prediabetes 02/07/2012   Dyslipidemia 02/07/2012   Hypertension 02/07/2012   Vitamin D deficiency 09/16/2011   MORBID OBESITY 10/09/2008      Review of Systems  All other systems reviewed and are negative.     Objective:     BP 118/82 (BP Location: Right Arm, Patient Position: Sitting, Cuff Size: Large)   Pulse 75   Temp 97.8 F (36.6 C) (Oral)   Ht 5'  2" (1.575 m)   Wt 240 lb 6.4 oz (109 kg)   LMP 05/04/2019   SpO2 98%   BMI 43.97 kg/m  BP Readings from Last 3 Encounters:  03/14/23 118/82  02/01/23 112/80  12/31/22 128/88      Physical Exam Vitals reviewed.  Constitutional:      Appearance: Normal appearance. She is well-groomed. She is morbidly obese.  HENT:     Right Ear: There is impacted cerumen.     Left Ear: There is impacted cerumen.  Cardiovascular:     Rate and Rhythm: Normal rate and regular rhythm.     Pulses: Normal pulses.     Heart sounds: S1 normal and S2 normal.  Pulmonary:     Effort: Pulmonary effort is normal.     Breath sounds: Normal breath sounds and air entry.  Neurological:     Mental Status: She is alert and oriented to person, place, and time. Mental status is at baseline.     Gait: Gait is intact.  Psychiatric:        Mood and Affect: Mood and affect normal.        Speech: Speech normal.        Behavior: Behavior normal.        Judgment: Judgment normal.    Cerumen impaction Irrigation Procedure note:  After verbal consent was obtained the patient's ear were irrigated with a  mixture of warm water and hydrogen peroxide. Both ears were successfully cleaned and the TM's were visualized on both sides.   Pt was given instructions on using Debrox ear drops at home as a preventative. She tolerated the procedure well.   No results found for any visits on 03/14/23.    The 10-year ASCVD risk score (Arnett DK, et al., 2019) is: 3.4%    Assessment & Plan:   Problem List Items Addressed This Visit       Unprioritized   MORBID OBESITY - Primary    Patient has FAILED treatment with phentermine and topiramate. She has been enrolled in this weight loss program since 10/01/2022 without any meaningful weight loss. I am recommending that the patient be started on Saxenda, titrating up to 3 mg daily for weight loss. Her comorbid conditions are BL knee pain, OSA, HTN, and prediabetes. Will see her back  every 3 months for weight checks. Her weight loss goals will remain the same, as listed below.   Total protein intake- 90 gram per day   Total carb intake: less than 90 grams per day   Total fiber intake - 25-30 grams    Total calorie intake- 2200 (base rate) Try to shoot for 1800-1900 per day       Relevant Medications   Liraglutide -Weight Management (SAXENDA) 18 MG/3ML SOPN   Other Visit Diagnoses     Bilateral impacted cerumen         S/p BL ear irrigation, pt tolerated procedure well.   Return in about 3 months (around 06/14/2023) for weight loss.    Farrel Conners, MD

## 2023-03-16 ENCOUNTER — Telehealth: Payer: Self-pay | Admitting: *Deleted

## 2023-03-16 NOTE — Assessment & Plan Note (Addendum)
Patient has FAILED treatment with phentermine and topiramate. She has been enrolled in this weight loss program since 10/01/2022 without any meaningful weight loss. I am recommending that the patient be started on Saxenda, titrating up to 3 mg daily for weight loss. Her comorbid conditions are BL knee pain, OSA, HTN, and prediabetes. Will see her back every 3 months for weight checks. Her weight loss goals will remain the same, as listed below.   Total protein intake- 90 gram per day   Total carb intake: less than 90 grams per day   Total fiber intake - 25-30 grams    Total calorie intake- 2200 (base rate) Try to shoot for 1800-1900 per day

## 2023-03-16 NOTE — Telephone Encounter (Signed)
[facility] faxed a prior auth request for Saxenda 18mg .  PA was sent to [email or web address]-Key: [id number] pending review by insurance.

## 2023-03-17 NOTE — Telephone Encounter (Signed)
Fax received stating the request was denied and given to PCP for review. 

## 2023-03-24 ENCOUNTER — Encounter: Payer: Self-pay | Admitting: Family Medicine

## 2023-04-20 NOTE — Telephone Encounter (Signed)
I called again they just need the last progress note from 03/14/2023 faxed to [phone] with the cover letter saying "PA Urgent"  [id number] -- after they receive the progress note they should approve the medication. Could you send this for me? Thanks!

## 2023-04-20 NOTE — Telephone Encounter (Signed)
Office notes from 03/14/2023 was printed and faxed to [facility] at the number below.

## 2023-05-04 ENCOUNTER — Other Ambulatory Visit: Payer: Self-pay | Admitting: Family Medicine

## 2023-05-05 MED ORDER — WEGOVY 0.25 MG/0.5ML ~~LOC~~ SOAJ: 0.2500 mg | SUBCUTANEOUS | 0 refills | Status: DC

## 2023-05-05 NOTE — Telephone Encounter (Signed)
Rx re-resent due to escribe transmission failure.

## 2023-05-05 NOTE — Telephone Encounter (Signed)
Sent wegovy-- there will likely be another PA

## 2023-05-11 ENCOUNTER — Encounter (HOSPITAL_BASED_OUTPATIENT_CLINIC_OR_DEPARTMENT_OTHER): Payer: Self-pay | Admitting: Emergency Medicine

## 2023-05-11 ENCOUNTER — Other Ambulatory Visit: Payer: Self-pay

## 2023-05-11 ENCOUNTER — Emergency Department (HOSPITAL_BASED_OUTPATIENT_CLINIC_OR_DEPARTMENT_OTHER)
Admission: EM | Admit: 2023-05-11 | Discharge: 2023-05-11 | Disposition: A | Discharge status: Home / Self Care | Payer: No Typology Code available for payment source | Attending: Emergency Medicine | Admitting: Emergency Medicine

## 2023-05-11 ENCOUNTER — Emergency Department (HOSPITAL_BASED_OUTPATIENT_CLINIC_OR_DEPARTMENT_OTHER): Payer: No Typology Code available for payment source | Admitting: Radiology

## 2023-05-11 DIAGNOSIS — E876 Hypokalemia: Secondary | ICD-10-CM

## 2023-05-11 DIAGNOSIS — I10 Essential (primary) hypertension: Secondary | ICD-10-CM | POA: Insufficient documentation

## 2023-05-11 DIAGNOSIS — Z79899 Other long term (current) drug therapy: Secondary | ICD-10-CM | POA: Diagnosis not present

## 2023-05-11 DIAGNOSIS — R079 Chest pain, unspecified: Secondary | ICD-10-CM | POA: Diagnosis present

## 2023-05-11 DIAGNOSIS — R0789 Other chest pain: Secondary | ICD-10-CM | POA: Diagnosis not present

## 2023-05-11 LAB — BASIC METABOLIC PANEL
Anion gap: 11 (ref 5–15)
BUN: 11 mg/dL (ref 6–20)
CO2: 31 mmol/L (ref 22–32)
Calcium: 9.9 mg/dL (ref 8.9–10.3)
Chloride: 96 mmol/L — ABNORMAL LOW (ref 98–111)
Creatinine, Ser: 0.71 mg/dL (ref 0.44–1.00)
GFR, Estimated: >60 mL/min (ref >60)
Glucose, Bld: 106 mg/dL — ABNORMAL HIGH (ref 70–99)
Potassium: 2.9 mmol/L — ABNORMAL LOW (ref 3.5–5.1)
Sodium: 138 mmol/L (ref 135–145)

## 2023-05-11 LAB — TROPONIN I (HIGH SENSITIVITY)
Troponin I (High Sensitivity): 2 ng/L (ref <18)
Troponin I (High Sensitivity): <2 ng/L (ref <18)

## 2023-05-11 LAB — CBC
HCT: 41.6 % (ref 36.0–46.0)
Hemoglobin: 14.1 g/dL (ref 12.0–15.0)
MCH: 29.1 pg (ref 26.0–34.0)
MCHC: 33.9 g/dL (ref 30.0–36.0)
MCV: 85.8 fL (ref 80.0–100.0)
Platelets: 413 10*3/uL — ABNORMAL HIGH (ref 150–400)
RBC: 4.85 MIL/uL (ref 3.87–5.11)
RDW: 13.8 % (ref 11.5–15.5)
WBC: 6.5 10*3/uL (ref 4.0–10.5)
nRBC: 0 % (ref 0.0–0.2)

## 2023-05-11 MED ORDER — CYCLOBENZAPRINE HCL 10 MG PO TABS: 10.0000 mg | ORAL_TABLET | Freq: Two times a day (BID) | ORAL | 0 refills | Status: DC | PRN

## 2023-05-11 MED ORDER — IBUPROFEN 600 MG PO TABS: 600.0000 mg | ORAL_TABLET | Freq: Four times a day (QID) | ORAL | 0 refills | Status: DC | PRN

## 2023-05-11 MED ORDER — POTASSIUM CHLORIDE CRYS ER 20 MEQ PO TBCR
40.0000 meq | EXTENDED_RELEASE_TABLET | Freq: Once | ORAL | Status: AC
  Administered 2023-05-11: 40 meq via ORAL
  Filled 2023-05-11: qty 2

## 2023-05-11 MED ORDER — KETOROLAC TROMETHAMINE 15 MG/ML IJ SOLN
15.0000 mg | Freq: Once | INTRAMUSCULAR | Status: AC
  Administered 2023-05-11: 15 mg via INTRAVENOUS
  Filled 2023-05-11: qty 1

## 2023-05-11 NOTE — ED Provider Notes (Signed)
[place] EMERGENCY DEPARTMENT AT [facility] Provider Note   CSN: [id number] Arrival date & time: 05/11/23  1619     History  Chief Complaint  Patient presents with   Chest Pain    Tasha Valencia is a 52 y.o. female.  The history is provided by the patient and medical records. No language interpreter was used.  Chest Pain    52 year old female significant history of hypertension, GERD, prediabetes, hyperlipidemia, OSA, obesity presented to ED for evaluation of chest pain.  Patient report for the past 3 days she has noticed pain in her chest.  She described pain as a soreness sensation to her left upper chest radiates towards her left shoulder.  Pain is more notable when she moves, turns around, or push on the affected area.  She did endorse 1 episode of mild shortness of breath this morning when she reached down to pick up her grandchildren but it was short lasting.  She did not endorse any associate lightheadedness, dizziness, nausea, diaphoresis with the chest pain.  Pains usually very short lasting.  She denies any strenuous activities or heavy lifting aside from lifting grandchildren.  She has never had a cardiac stress test in the past but does follow-up with cardiology.  No prior history of PE or DVT.  No pain with eating.  Pain is minimal at this time.  Home Medications Prior to Admission medications   Medication Sig Start Date End Date Taking? Authorizing Provider  CALCIUM PO Take 600 mg by mouth daily.    [provider]  chlorthalidone (HYGROTON) 25 MG tablet Take 1 tablet (25 mg total) by mouth daily. 01/26/23 04/26/23  Runell Gess, MD  Cholecalciferol (VITAMIN D-3) 25 MCG (1000 UT) CAPS Take by mouth daily.    [provider]  Ferrous Sulfate (IRON) 325 (65 Fe) MG TABS Take by mouth daily.    [provider]  POTASSIUM PO Take by mouth daily.    [provider]  Semaglutide-Weight Management (WEGOVY) 0.25 MG/0.5ML SOAJ  Inject 0.25 mg into the skin once a week. 05/05/23   Karie Georges, MD  topiramate (TOPAMAX) 50 MG tablet TAKE 1 TABLET BY MOUTH EVERYDAY AT BEDTIME 05/04/23   Karie Georges, MD      Allergies    Patient has no known allergies.    Review of Systems   Review of Systems  Cardiovascular:  Positive for chest pain.  All other systems reviewed and are negative.   Physical Exam Updated Vital Signs BP 127/80   Pulse 86   Temp 98.6 F (37 C) (Oral)   Resp 20   Ht 5\' 3"  (1.6 m)   Wt 109.8 kg   LMP 05/04/2019   SpO2 100%   BMI 42.87 kg/m  Physical Exam Vitals and nursing note reviewed.  Constitutional:      General: She is not in acute distress.    Appearance: She is well-developed.  HENT:     Head: Atraumatic.  Eyes:     Conjunctiva/sclera: Conjunctivae normal.  Cardiovascular:     Rate and Rhythm: Normal rate and regular rhythm.     Pulses: Normal pulses.     Heart sounds: Normal heart sounds.  Pulmonary:     Effort: Pulmonary effort is normal.  Chest:     Chest wall: Tenderness (Mild tenderness noted to left upper chest wall on palpation without any crepitus or emphysema.  No overlying skin rash.) present.  Abdominal:     Palpations:  Abdomen is soft.  Musculoskeletal:     Cervical back: Neck supple.  Skin:    Capillary Refill: Capillary refill takes less than 2 seconds.     Findings: No rash.  Neurological:     Mental Status: She is alert.  Psychiatric:        Mood and Affect: Mood normal.     ED Results / Procedures / Treatments   Labs (all labs ordered are listed, but only abnormal results are displayed) Labs Reviewed  BASIC METABOLIC PANEL - Abnormal; Notable for the following components:      Result Value   Potassium 2.9 (*)    Chloride 96 (*)    Glucose, Bld 106 (*)    All other components within normal limits  CBC - Abnormal; Notable for the following components:   Platelets 413 (*)    All other components within normal limits  TROPONIN I  (HIGH SENSITIVITY)  TROPONIN I (HIGH SENSITIVITY)    EKG EKG Interpretation  Date/Time:  Wednesday May 11 2023 16:30:20 EDT Ventricular Rate:  92 PR Interval:  149 QRS Duration: 82 QT Interval:  374 QTC Calculation: 463 R Axis:   42 Text Interpretation: Sinus rhythm Probable left atrial enlargement since last tracing no significant change Confirmed by Rolan Bucco [phone]) on 05/11/2023 5:29:07 PM  Date: 05/11/2023  Rate: 92  Rhythm: normal sinus rhythm  QRS Axis: normal  Intervals: normal  ST/T Wave abnormalities: normal  Conduction Disutrbances: none  Narrative Interpretation:   Old EKG Reviewed: No significant changes noted    Radiology DG Chest 2 View  Result Date: 05/11/2023 CLINICAL DATA:  Chest pain EXAM: CHEST - 2 VIEW COMPARISON:  None Available. FINDINGS: No consolidation, pneumothorax or effusion. No edema. Normal cardiopericardial silhouette. Tortuous ectatic aorta. Degenerative changes seen of the spine. Slight elevation of the right hemidiaphragm. IMPRESSION: No acute cardiopulmonary disease.  Elevated right hemidiaphragm. Electronically Signed   By: Karen Kays M.D.   On: 05/11/2023 17:09    Procedures Procedures    Medications Ordered in ED Medications  potassium chloride SA (KLOR-CON M) CR tablet 40 mEq (40 mEq Oral Given 05/11/23 1732)  ketorolac (TORADOL) 15 MG/ML injection 15 mg (15 mg Intravenous Given 05/11/23 1852)    ED Course/ Medical Decision Making/ A&P             HEART Score: 3                Medical Decision Making Amount and/or Complexity of Data Reviewed Labs: ordered. Radiology: ordered.  Risk Prescription drug management.   BP 127/80   Pulse 86   Temp 98.6 F (37 C) (Oral)   Resp 20   Ht 5\' 3"  (1.6 m)   Wt 109.8 kg   LMP 05/04/2019   SpO2 100%   BMI 42.87 kg/m   60:27 PM  52 year old female significant history of hypertension, GERD, prediabetes, hyperlipidemia, OSA, obesity presented to ED for evaluation of chest  pain.  Patient report for the past 3 days she has noticed pain in her chest.  She described pain as a soreness sensation to her left upper chest radiates towards her left shoulder.  Pain is more notable when she moves, turns around, or push on the affected area.  She did endorse 1 episode of mild shortness of breath this morning when she reached down to pick up her grandchildren but it was short lasting.  She did not endorse any associate lightheadedness, dizziness, nausea, diaphoresis with the chest pain.  Pains usually very short lasting.  She denies any strenuous activities or heavy lifting aside from lifting grandchildren.  She has never had a cardiac stress test in the past but does follow-up with cardiology.  No prior history of PE or DVT.  No pain with eating.  Pain is minimal at this time.  On exam this is a well-appearing obese female resting comfortably in bed appears to be in no acute discomfort.  She does have some mild tenderness noted to left upper chest wall on palpation but no overlying skin changes or concerning rash.  Heart with normal rate and rhythm, lungs clear to auscultation bilaterally abdomen is soft nontender she has intact radial pulses bilaterally.  Left shoulder with full range of motion.  Vital signs overall reassuring.  -Labs ordered, independently viewed and interpreted by me.  Labs remarkable for K+ 2.9, supplementation given.  No concerning EKG changes.  Normal initial trop, awaits delta trop -The patient was maintained on a cardiac monitor.  I personally viewed and interpreted the cardiac monitored which showed an underlying rhythm of: NSR -Imaging independently viewed and interpreted by me and I agree with radiologist's interpretation.  Result remarkable for CXR without concerning finding -This patient presents to the ED for concern of chest pain, this involves an extensive number of treatment options, and is a complaint that carries with it a high risk of complications  and morbidity.  The differential diagnosis includes costochondritis, chest wall pain, MI, PTX, PNA, GERD, MSK -Co morbidities that complicate the patient evaluation includes obesity, HTN, GERD, prediabetes -Treatment includes toradol -Reevaluation of the patient after these medicines showed that the patient improved -PCP office notes or outside notes reviewed -Escalation to admission/observation considered: patients feels much better, is comfortable with discharge, and will follow up with PCP -Prescription medication considered, patient comfortable with NSAIDs and muscle relaxant -Social Determinant of Health considered which includes inadequate activity  7:33 PM Improvement of symptoms after Toradol.  Suspect MSK pain.  Cardiac workup unremarkable.  Potassium 2.9, supplementation given.  Recommend eating a banana daily and follow-up with PCP in a week for recheck.  Return precaution given.  Patient discharged home with supportive care.         Final Clinical Impression(s) / ED Diagnoses Final diagnoses:  Atypical chest pain  Hypokalemia    Rx / DC Orders ED Discharge Orders          Ordered    Ambulatory referral to Cardiology       Comments: If you have not heard from the Cardiology office within the next 72 hours please call [phone].   05/11/23 1936    ibuprofen (ADVIL) 600 MG tablet  Every 6 hours PRN        05/11/23 1936    cyclobenzaprine (FLEXERIL) 10 MG tablet  2 times daily PRN        05/11/23 1936              Fayrene Helper, PA-C 05/11/23 1937    Rolan Bucco, MD 05/11/23 2250

## 2023-05-11 NOTE — ED Notes (Signed)
Discharge paperwork given and verbally understood. 

## 2023-05-11 NOTE — Discharge Instructions (Addendum)
You have been evaluated for your chest pain.  Fortunately no concerning findings were noted on today's exam.  Your potassium level is low, eat a banana daily for the next week and follow-up with your doctor for recheck of the potassium.  You may follow-up outpatient with cardiology for further assessments of your chest pain.

## 2023-05-11 NOTE — ED Triage Notes (Signed)
Pt arrives to ED with c/o substernal chest pain last a few seconds that has been on-going over the past 3 days  that radiates to her left arm.

## 2023-05-18 ENCOUNTER — Other Ambulatory Visit (HOSPITAL_COMMUNITY): Payer: Self-pay

## 2023-05-18 ENCOUNTER — Telehealth: Payer: Self-pay

## 2023-05-18 NOTE — Telephone Encounter (Signed)
Patient Advocate Encounter  Prior Authorization for Tasha Valencia has been approved with [employer].    Effective dates: 05/13/23 through 12/13/23

## 2023-05-18 NOTE — Telephone Encounter (Signed)
See prior note-PA previously and approved.

## 2023-06-15 MED ORDER — WEGOVY 0.5 MG/0.5ML ~~LOC~~ SOAJ
0.5000 mg | SUBCUTANEOUS | 0 refills | Status: DC
Start: 2023-06-15 — End: 2024-03-15

## 2023-06-15 NOTE — Addendum Note (Signed)
Addended by: Karie Georges on: 06/15/2023 08:09 AM   Modules accepted: Orders

## 2023-06-27 MED ORDER — NALTREXONE-BUPROPION HCL ER 8-90 MG PO TB12
ORAL_TABLET | ORAL | 5 refills | Status: DC
Start: 2023-06-27 — End: 2024-03-15

## 2023-06-27 NOTE — Addendum Note (Signed)
Addended by: Karie Georges on: 06/27/2023 12:54 PM   Modules accepted: Orders

## 2023-11-01 ENCOUNTER — Other Ambulatory Visit: Payer: Self-pay | Admitting: Family Medicine

## 2023-11-01 DIAGNOSIS — Z1231 Encounter for screening mammogram for malignant neoplasm of breast: Secondary | ICD-10-CM

## 2023-11-03 ENCOUNTER — Ambulatory Visit: Payer: No Typology Code available for payment source

## 2023-11-04 DIAGNOSIS — Z1231 Encounter for screening mammogram for malignant neoplasm of breast: Secondary | ICD-10-CM

## 2023-11-22 ENCOUNTER — Ambulatory Visit: Payer: No Typology Code available for payment source

## 2023-11-23 ENCOUNTER — Ambulatory Visit
Admission: RE | Admit: 2023-11-23 | Discharge: 2023-11-23 | Disposition: A | Discharge status: Home / Self Care | Payer: No Typology Code available for payment source | Source: Ambulatory Visit | Attending: Family Medicine | Admitting: Family Medicine

## 2023-11-23 DIAGNOSIS — Z1231 Encounter for screening mammogram for malignant neoplasm of breast: Secondary | ICD-10-CM

## 2024-02-21 ENCOUNTER — Other Ambulatory Visit: Payer: Self-pay | Admitting: Cardiovascular Disease

## 2024-02-28 ENCOUNTER — Encounter: Payer: Self-pay | Admitting: Family Medicine

## 2024-02-29 NOTE — Telephone Encounter (Signed)
 Almost 1 year since patient's last visit- I advised she schedule an appointment.

## 2024-02-29 NOTE — Telephone Encounter (Signed)
 Noted.

## 2024-03-15 ENCOUNTER — Telehealth: Payer: Self-pay

## 2024-03-15 ENCOUNTER — Ambulatory Visit (INDEPENDENT_AMBULATORY_CARE_PROVIDER_SITE_OTHER): Admitting: Family Medicine

## 2024-03-15 ENCOUNTER — Other Ambulatory Visit (HOSPITAL_COMMUNITY): Payer: Self-pay

## 2024-03-15 MED ORDER — WEGOVY 1 MG/0.5ML ~~LOC~~ SOAJ
1.0000 mg | SUBCUTANEOUS | 0 refills | Status: DC
Start: 2024-03-15 — End: 2024-06-15

## 2024-03-15 MED ORDER — WEGOVY 0.25 MG/0.5ML ~~LOC~~ SOAJ
0.2500 mg | SUBCUTANEOUS | 0 refills | Status: DC
Start: 2024-03-15 — End: 2024-06-15

## 2024-03-15 MED ORDER — WEGOVY 0.5 MG/0.5ML ~~LOC~~ SOAJ
0.5000 mg | SUBCUTANEOUS | 0 refills | Status: DC
Start: 2024-03-15 — End: 2024-06-15

## 2024-03-15 NOTE — Progress Notes (Signed)
 Established Patient Office Visit  Subjective   Patient ID: Tasha Valencia, female    DOB: [birth year]  Age: 53 y.o. MRN: [id number]  Chief Complaint  Patient presents with   Obesity    Patient requests to discuss medications for weight loss   Questioned when pap smears are due at this age   Immunizations    Patient requests to discuss the Shingrix vaccine-questioned if this is needed, side effects?    Pt is here for weight loss follow up. States that after the first month of wegovy her insurance deductible reset and the medication was too expensive. She reports taht the month she took the medication is worked extremely well, had lost 8 pounds. States that she wants to try again with the wegovy. She reports she continues to watch her diet and exercise. We reviewed our previous goals and weight loss plan. The goals I gave her are as follows:  Total protein intake- 90 gram per day   Total carb intake: less than 90 grams per day   Total fiber intake - 25-30 grams    Total calorie intake- 2200 (base rate) Try to shoot for 1800-1900 per day  Patient reports that the phentermine and topiramate just did not work very well to control her appetite.     Current Outpatient Medications  Medication Instructions   CALCIUM PO 600 mg, Daily   chlorthalidone (HYGROTON) 25 mg, Oral, Daily, PT. MUST MAKE AN APPOINTMENT IN ORDER TO RECEIVE FUTURE REFILLS. FIRST ATTEMPT.   Cholecalciferol (VITAMIN D-3) 25 MCG (1000 UT) CAPS Daily   Ferrous Sulfate (IRON) 325 (65 Fe) MG TABS Daily   POTASSIUM PO Daily   Wegovy 0.25 mg, Subcutaneous, Weekly   Wegovy 0.5 mg, Subcutaneous, Weekly   Wegovy 1 mg, Subcutaneous, Weekly    Patient Active Problem List   Diagnosis Date Noted   Carpal tunnel syndrome, right upper limb 06/24/2022   Numbness and tingling in right hand 05/21/2022   Obstructive sleep apnea 05/15/2021   Uterine fibroid 06/04/2019   Leiomyoma of uterus, unspecified 05/31/2019   Routine  general medical examination at a health care facility 06/01/2012   Contraceptive device, intrauterine 06/01/2012   Prediabetes 02/07/2012   Dyslipidemia 02/07/2012   Hypertension 02/07/2012   Vitamin D deficiency 09/16/2011   MORBID OBESITY 10/09/2008      Review of Systems  All other systems reviewed and are negative.     Objective:     BP 132/80   Pulse 70   Temp 98.1 F (36.7 C) (Oral)   Ht 5\' 3"  (1.6 m)   Wt 246 lb 3.2 oz (111.7 kg)   LMP 05/04/2019   SpO2 96%   BMI 43.61 kg/m    Physical Exam Vitals reviewed.  Constitutional:      Appearance: Normal appearance. She is well-groomed. She is morbidly obese.  HENT:     Right Ear: Tympanic membrane and ear canal normal.     Left Ear: Tympanic membrane and ear canal normal.  Cardiovascular:     Rate and Rhythm: Normal rate and regular rhythm.     Heart sounds: S1 normal and S2 normal.  Pulmonary:     Effort: Pulmonary effort is normal.     Breath sounds: Normal breath sounds and air entry.  Neurological:     Mental Status: She is alert and oriented to person, place, and time. Mental status is at baseline.     Gait: Gait is intact.  Psychiatric:  Mood and Affect: Mood and affect normal.        Speech: Speech normal.        Behavior: Behavior normal.        Judgment: Judgment normal.      No results found for any visits on 03/15/24.    The 10-year ASCVD risk score (Arnett DK, et al., 2019) is: 5.4%    Assessment & Plan:  MORBID OBESITY -     Wegovy; Inject 0.25 mg into the skin once a week.  Dispense: 2 mL; Refill: 0 -     Wegovy; Inject 0.5 mg into the skin once a week.  Dispense: 2 mL; Refill: 0 -     Wegovy; Inject 1 mg into the skin once a week.  Dispense: 2 mL; Refill: 0    I went over her diet plan again and handouts were given. She did have a good response to the wegovy for the 1 month that she took it . I will continue to monitor her progress and she reports she is committed to her diet  and exercise plan and will continue this while on the medication.  Return in about 3 months (around 06/15/2024) for annual physical with pap smear.    Karie Georges, MD

## 2024-03-15 NOTE — Patient Instructions (Signed)
 Debrox ear drops OTC or  1 part hydrogen peroxide to 3-4 parts water ,pour into ear  and leave for a couple minutes and the dump out.

## 2024-03-15 NOTE — Telephone Encounter (Signed)
 Pharmacy Patient Advocate Encounter   Received notification from CoverMyMeds that prior authorization for [facility] 0.25MG /0.5ML auto-injectors is required/requested.   Insurance verification completed.   The patient is insured through [facility] .   Per test claim: PA required; PA submitted to above mentioned insurance via CoverMyMeds Key/confirmation #/EOC (Key: [id number])   Status is pending

## 2024-03-16 ENCOUNTER — Other Ambulatory Visit (HOSPITAL_COMMUNITY): Payer: Self-pay

## 2024-03-16 NOTE — Telephone Encounter (Signed)
 Pharmacy Patient Advocate Encounter  Received notification from [facility] that Prior Authorization for [facility] 0.25MG /0.5ML auto-injectors  has been APPROVED from 03/15/24 to 10/15/24   PA #/Case ID/Reference #: [id number]

## 2024-06-04 ENCOUNTER — Encounter: Payer: Self-pay | Admitting: Family Medicine

## 2024-06-04 DIAGNOSIS — I10 Essential (primary) hypertension: Secondary | ICD-10-CM

## 2024-06-04 MED ORDER — CHLORTHALIDONE 25 MG PO TABS: 25.0000 mg | ORAL_TABLET | Freq: Every day | ORAL | 1 refills | Status: DC

## 2024-06-15 ENCOUNTER — Ambulatory Visit: Admitting: Family Medicine

## 2024-06-15 ENCOUNTER — Encounter: Payer: Self-pay | Admitting: Family Medicine

## 2024-06-15 VITALS — BP 110/78 | HR 82 | Temp 97.7°F | Ht 62.75 in | Wt 231.7 lb

## 2024-06-15 DIAGNOSIS — R7303 Prediabetes: Secondary | ICD-10-CM | POA: Diagnosis not present

## 2024-06-15 DIAGNOSIS — Z0000 Encounter for general adult medical examination without abnormal findings: Secondary | ICD-10-CM | POA: Diagnosis not present

## 2024-06-15 DIAGNOSIS — E559 Vitamin D deficiency, unspecified: Secondary | ICD-10-CM

## 2024-06-15 DIAGNOSIS — E785 Hyperlipidemia, unspecified: Secondary | ICD-10-CM | POA: Diagnosis not present

## 2024-06-15 DIAGNOSIS — I10 Essential (primary) hypertension: Secondary | ICD-10-CM

## 2024-06-15 LAB — COMPREHENSIVE METABOLIC PANEL WITH GFR
ALT: 35 U/L (ref 0–35)
AST: 22 U/L (ref 0–37)
Albumin: 4.2 g/dL (ref 3.5–5.2)
Alkaline Phosphatase: 52 U/L (ref 39–117)
BUN: 13 mg/dL (ref 6–23)
CO2: 30 meq/L (ref 19–32)
Calcium: 9.7 mg/dL (ref 8.4–10.5)
Chloride: 98 meq/L (ref 96–112)
Creatinine, Ser: 0.7 mg/dL (ref 0.40–1.20)
GFR: 98.3 mL/min (ref >60.00)
Glucose, Bld: 85 mg/dL (ref 70–99)
Potassium: 2.9 meq/L — ABNORMAL LOW (ref 3.5–5.1)
Sodium: 138 meq/L (ref 135–145)
Total Bilirubin: 0.7 mg/dL (ref 0.2–1.2)
Total Protein: 8.4 g/dL — ABNORMAL HIGH (ref 6.0–8.3)

## 2024-06-15 LAB — CBC WITH DIFFERENTIAL/PLATELET
Basophils Absolute: 0 10*3/uL (ref 0.0–0.1)
Basophils Relative: 0.7 % (ref 0.0–3.0)
Eosinophils Absolute: 0 10*3/uL (ref 0.0–0.7)
Eosinophils Relative: 0.7 % (ref 0.0–5.0)
HCT: 41.6 % (ref 36.0–46.0)
Hemoglobin: 14.1 g/dL (ref 12.0–15.0)
Lymphocytes Relative: 61.1 % — ABNORMAL HIGH (ref 12.0–46.0)
Lymphs Abs: 3 10*3/uL (ref 0.7–4.0)
MCHC: 33.9 g/dL (ref 30.0–36.0)
MCV: 82.8 fl (ref 78.0–100.0)
Monocytes Absolute: 0.3 10*3/uL (ref 0.1–1.0)
Monocytes Relative: 7.2 % (ref 3.0–12.0)
Neutro Abs: 1.5 10*3/uL (ref 1.4–7.7)
Neutrophils Relative %: 30.3 % — ABNORMAL LOW (ref 43.0–77.0)
Platelets: 382 10*3/uL (ref 150.0–400.0)
RBC: 5.03 Mil/uL (ref 3.87–5.11)
RDW: 13.8 % (ref 11.5–15.5)
WBC: 4.9 10*3/uL (ref 4.0–10.5)

## 2024-06-15 LAB — LIPID PANEL
Cholesterol: 156 mg/dL (ref 0–200)
HDL: 38.8 mg/dL — ABNORMAL LOW (ref >39.00)
LDL Cholesterol: 103 mg/dL — ABNORMAL HIGH (ref 0–99)
NonHDL: 117.41
Total CHOL/HDL Ratio: 4
Triglycerides: 72 mg/dL (ref 0.0–149.0)
VLDL: 14.4 mg/dL (ref 0.0–40.0)

## 2024-06-15 LAB — TSH: TSH: 0.76 u[IU]/mL (ref 0.35–5.50)

## 2024-06-15 LAB — HEMOGLOBIN A1C: Hgb A1c MFr Bld: 5.7 % (ref 4.6–6.5)

## 2024-06-15 LAB — VITAMIN D 25 HYDROXY (VIT D DEFICIENCY, FRACTURES): VITD: 31.1 ng/mL (ref 30.00–100.00)

## 2024-06-15 MED ORDER — WEGOVY 1.7 MG/0.75ML ~~LOC~~ SOAJ
1.7000 mg | SUBCUTANEOUS | 0 refills | Status: DC
Start: 2024-06-15 — End: 2024-10-02

## 2024-06-15 MED ORDER — WEGOVY 2.4 MG/0.75ML ~~LOC~~ SOAJ: 2.4000 mg | SUBCUTANEOUS | 5 refills | Status: DC

## 2024-06-15 NOTE — Patient Instructions (Addendum)
 Debrox 1-2 drops ear ear 1-2 times a week  Health Maintenance, Female Adopting a healthy lifestyle and getting preventive care are important in promoting health and wellness. Ask your health care provider about: The right schedule for you to have regular tests and exams. Things you can do on your own to prevent diseases and keep yourself healthy. What should I know about diet, weight, and exercise? Eat a healthy diet  Eat a diet that includes plenty of vegetables, fruits, low-fat dairy products, and lean protein. Do not eat a lot of foods that are high in solid fats, added sugars, or sodium. Maintain a healthy weight Body mass index (BMI) is used to identify weight problems. It estimates body fat based on height and weight. Your health care provider can help determine your BMI and help you achieve or maintain a healthy weight. Get regular exercise Get regular exercise. This is one of the most important things you can do for your health. Most adults should: Exercise for at least 150 minutes each week. The exercise should increase your heart rate and make you sweat (moderate-intensity exercise). Do strengthening exercises at least twice a week. This is in addition to the moderate-intensity exercise. Spend less time sitting. Even light physical activity can be beneficial. Watch cholesterol and blood lipids Have your blood tested for lipids and cholesterol at 54 years of age, then have this test every 5 years. Have your cholesterol levels checked more often if: Your lipid or cholesterol levels are high. You are older than 54 years of age. You are at high risk for heart disease. What should I know about cancer screening? Depending on your health history and family history, you may need to have cancer screening at various ages. This may include screening for: Breast cancer. Cervical cancer. Colorectal cancer. Skin cancer. Lung cancer. What should I know about heart disease, diabetes, and  high blood pressure? Blood pressure and heart disease High blood pressure causes heart disease and increases the risk of stroke. This is more likely to develop in people who have high blood pressure readings or are overweight. Have your blood pressure checked: Every 3-5 years if you are 49-64 years of age. Every year if you are 38 years old or older. Diabetes Have regular diabetes screenings. This checks your fasting blood sugar level. Have the screening done: Once every three years after age 62 if you are at a normal weight and have a low risk for diabetes. More often and at a younger age if you are overweight or have a high risk for diabetes. What should I know about preventing infection? Hepatitis B If you have a higher risk for hepatitis B, you should be screened for this virus. Talk with your health care provider to find out if you are at risk for hepatitis B infection. Hepatitis C Testing is recommended for: Everyone born from [birth year] through 1966. Anyone with known risk factors for hepatitis C. Sexually transmitted infections (STIs) Get screened for STIs, including gonorrhea and chlamydia, if: You are sexually active and are younger than 54 years of age. You are older than 54 years of age and your health care provider tells you that you are at risk for this type of infection. Your sexual activity has changed since you were last screened, and you are at increased risk for chlamydia or gonorrhea. Ask your health care provider if you are at risk. Ask your health care provider about whether you are at high risk for HIV. Your health care  provider may recommend a prescription medicine to help prevent HIV infection. If you choose to take medicine to prevent HIV, you should first get tested for HIV. You should then be tested every 3 months for as long as you are taking the medicine. Pregnancy If you are about to stop having your period (premenopausal) and you may become pregnant, seek counseling  before you get pregnant. Take 400 to 800 micrograms (mcg) of folic acid every day if you become pregnant. Ask for birth control (contraception) if you want to prevent pregnancy. Osteoporosis and menopause Osteoporosis is a disease in which the bones lose minerals and strength with aging. This can result in bone fractures. If you are 6 years old or older, or if you are at risk for osteoporosis and fractures, ask your health care provider if you should: Be screened for bone loss. Take a calcium or vitamin D  supplement to lower your risk of fractures. Be given hormone replacement therapy (HRT) to treat symptoms of menopause. Follow these instructions at home: Alcohol use Do not drink alcohol if: Your health care provider tells you not to drink. You are pregnant, may be pregnant, or are planning to become pregnant. If you drink alcohol: Limit how much you have to: 0-1 drink a day. Know how much alcohol is in your drink. In the [place] one drink equals one 12 oz bottle of beer (355 mL), one 5 oz glass of wine (148 mL), or one 1 oz glass of hard liquor (44 mL). Lifestyle Do not use any products that contain nicotine or tobacco. These products include cigarettes, chewing tobacco, and vaping devices, such as e-cigarettes. If you need help quitting, ask your health care provider. Do not use street drugs. Do not share needles. Ask your health care provider for help if you need support or information about quitting drugs. General instructions Schedule regular health, dental, and eye exams. Stay current with your vaccines. Tell your health care provider if: You often feel depressed. You have ever been abused or do not feel safe at home. Summary Adopting a healthy lifestyle and getting preventive care are important in promoting health and wellness. Follow your health care provider's instructions about healthy diet, exercising, and getting tested or screened for diseases. Follow your health care  provider's instructions on monitoring your cholesterol and blood pressure. This information is not intended to replace advice given to you by your health care provider. Make sure you discuss any questions you have with your health care provider. Document Revised: 05/04/2021 Document Reviewed: 05/04/2021 Elsevier Patient Education  2024 [employer].

## 2024-06-15 NOTE — Progress Notes (Signed)
 Complete physical exam  Patient: Tasha Valencia   DOB: [birth year]   53 y.o. Female  MRN: [id number]  Subjective:    Chief Complaint  Patient presents with   Annual Exam    Pt is fasting and for pap. Would like to discuss increasing wegovy . Rn on 1mg .     Tasha Valencia is a 53 y.o. female who presents today for a complete physical exam. She reports consuming a diabetic, low calorie diet. Home exercise routine includes walking 1-2 hrs per week. She generally feels well. She reports sleeping fairly well. She does not have additional problems to discuss today.    Most recent fall risk assessment:    12/12/2019    4:40 PM  Fall Risk   Falls in the past year? 0   Number falls in past yr: 0   Injury with Fall? 0  Follow up Falls evaluation completed      Data saved with a previous flowsheet row definition     Most recent depression screenings:    03/15/2024    4:23 PM 02/01/2023   11:35 AM  PHQ 2/9 Scores  PHQ - 2 Score 0 0  PHQ- 9 Score 2 3    Vision:Within last year and Dental: No current dental problems and Receives regular dental care  Patient Active Problem List   Diagnosis Date Noted   Carpal tunnel syndrome, right upper limb 06/24/2022   Numbness and tingling in right hand 05/21/2022   Obstructive sleep apnea 05/15/2021   Uterine fibroid 06/04/2019   Leiomyoma of uterus, unspecified 05/31/2019   Routine general medical examination at a health care facility 06/01/2012   Contraceptive device, intrauterine 06/01/2012   Prediabetes 02/07/2012   Dyslipidemia 02/07/2012   Hypertension 02/07/2012   Vitamin D  deficiency 09/16/2011   MORBID OBESITY 10/09/2008      Patient Care Team: Aida House, MD as PCP - General (Family Medicine) Avanell Leigh, MD as PCP - Cardiology (Cardiology)   Outpatient Medications Prior to Visit  Medication Sig   CALCIUM PO Take 600 mg by mouth daily.   chlorthalidone  (HYGROTON ) 25 MG tablet Take 1 tablet (25 mg total) by  mouth daily.   Cholecalciferol (VITAMIN D -3) 25 MCG (1000 UT) CAPS Take by mouth daily.   cyanocobalamin (VITAMIN B12) 500 MCG tablet Take 500 mcg by mouth daily.   Ferrous Sulfate (IRON) 325 (65 Fe) MG TABS Take by mouth daily.   POTASSIUM PO Take by mouth daily.   [DISCONTINUED] Semaglutide -Weight Management (WEGOVY ) 1 MG/0.5ML SOAJ Inject 1 mg into the skin once a week.   [DISCONTINUED] Semaglutide -Weight Management (WEGOVY ) 0.25 MG/0.5ML SOAJ Inject 0.25 mg into the skin once a week. (Patient not taking: Reported on 06/15/2024)   [DISCONTINUED] Semaglutide -Weight Management (WEGOVY ) 0.5 MG/0.5ML SOAJ Inject 0.5 mg into the skin once a week. (Patient not taking: Reported on 06/15/2024)   No facility-administered medications prior to visit.    Review of Systems  HENT:  Negative for hearing loss.   Eyes:  Negative for blurred vision.  Respiratory:  Negative for shortness of breath.   Cardiovascular:  Negative for chest pain.  Gastrointestinal: Negative.   Genitourinary: Negative.   Musculoskeletal:  Negative for back pain.  Neurological:  Negative for headaches.  Psychiatric/Behavioral:  Negative for depression.        Objective:     BP 110/78 (BP Location: Right Arm, Patient Position: Sitting, Cuff Size: Large)   Pulse 82   Temp 97.7 F (36.5 C) (  Oral)   Ht 5' 2.75 (1.594 m)   Wt 231 lb 11.2 oz (105.1 kg)   LMP 05/04/2019   SpO2 94%   BMI 41.37 kg/m    Physical Exam Vitals reviewed.  Constitutional:      Appearance: Normal appearance. She is well-groomed. She is morbidly obese.  HENT:     Right Ear: Tympanic membrane and ear canal normal.     Left Ear: Tympanic membrane and ear canal normal.     Mouth/Throat:     Mouth: Mucous membranes are moist.     Pharynx: No posterior oropharyngeal erythema.   Eyes:     Conjunctiva/sclera: Conjunctivae normal.   Neck:     Thyroid : No thyromegaly.   Cardiovascular:     Rate and Rhythm: Normal rate and regular rhythm.      Pulses: Normal pulses.     Heart sounds: S1 normal and S2 normal.  Pulmonary:     Effort: Pulmonary effort is normal.     Breath sounds: Normal breath sounds and air entry.  Abdominal:     General: Abdomen is flat. Bowel sounds are normal.     Palpations: Abdomen is soft.   Musculoskeletal:     Right lower leg: No edema.     Left lower leg: No edema.  Lymphadenopathy:     Cervical: No cervical adenopathy.   Neurological:     Mental Status: She is alert and oriented to person, place, and time. Mental status is at baseline.     Gait: Gait is intact.   Psychiatric:        Mood and Affect: Mood and affect normal.        Speech: Speech normal.        Behavior: Behavior normal.        Judgment: Judgment normal.      No results found for any visits on 06/15/24.     Assessment & Plan:    Routine Health Maintenance and Physical Exam  Immunization History  Administered Date(s) Administered   Influenza Split 09/14/2011   Influenza,inj,Quad PF,6+ Mos 10/15/2013, 09/15/2018, 09/08/2019, 01/06/2021, 10/01/2022   Influenza-Unspecified 10/15/2013, 10/30/2015, 10/26/2017, 09/07/2018, 11/27/2023   Moderna Sars-Covid-2 Vaccination 01/06/2021   Tdap 10/01/2011, 03/14/2019    Health Maintenance  Topic Date Due   COVID-19 Vaccine (2 - 2024-25 season) 07/01/2024 (Originally 08/28/2023)   Zoster Vaccines- Shingrix (1 of 2) 09/15/2024 (Originally 05/24/2020)   Hepatitis C Screening  03/15/2025 (Originally 05/24/1988)   HIV Screening  03/15/2025 (Originally 05/24/1985)   INFLUENZA VACCINE  07/27/2024   MAMMOGRAM  11/22/2025   Cervical Cancer Screening (HPV/Pap Cotest)  06/16/2026   Colonoscopy  12/27/2026   DTaP/Tdap/Td (3 - Td or Tdap) 03/13/2029   HPV VACCINES  Aged Out   Meningococcal B Vaccine  Aged Out    Discussed health benefits of physical activity, and encouraged her to engage in regular exercise appropriate for her age and condition.  Routine general medical examination at a  health care facility -     CBC with Differential/Platelet; Future  MORBID OBESITY -     TSH; Future -     Wegovy ; Inject 1.7 mg into the skin once a week.  Dispense: 3 mL; Refill: 0 -     Wegovy ; Inject 2.4 mg into the skin once a week.  Dispense: 3 mL; Refill: 5  Dyslipidemia -     Lipid panel; Future  Primary hypertension -     Comprehensive metabolic panel with GFR; Future  Prediabetes -  Hemoglobin A1c; Future  Vitamin D  deficiency -     VITAMIN D  25 Hydroxy (Vit-D Deficiency, Fractures); Future  Normal physical exam findings. I counseled the patient on the recommended amount of exercise per CDC recommendation. I also counseled her on the shingrix vaccinations and handouts were given for this as well. I reviewed preventative screening, immunizations, and medical history and updated in the chart, and appropriate labs and vaccinations were ordered. Handouts given on healthy eating and exercise.    Return in 3 months (on 09/15/2024).     Aida House, MD

## 2024-06-21 ENCOUNTER — Ambulatory Visit: Payer: Self-pay | Admitting: Family Medicine

## 2024-06-21 DIAGNOSIS — E876 Hypokalemia: Secondary | ICD-10-CM

## 2024-06-21 MED ORDER — POTASSIUM CHLORIDE CRYS ER 20 MEQ PO TBCR
40.0000 meq | EXTENDED_RELEASE_TABLET | Freq: Every day | ORAL | 3 refills | Status: DC
Start: 2024-06-21 — End: 2024-09-19

## 2024-09-12 ENCOUNTER — Encounter: Payer: Self-pay | Admitting: Family Medicine

## 2024-09-19 ENCOUNTER — Other Ambulatory Visit: Payer: Self-pay | Admitting: Family Medicine

## 2024-09-19 DIAGNOSIS — E876 Hypokalemia: Secondary | ICD-10-CM

## 2024-09-24 ENCOUNTER — Ambulatory Visit: Admitting: Family Medicine

## 2024-10-02 ENCOUNTER — Encounter: Payer: Self-pay | Admitting: Family Medicine

## 2024-10-02 ENCOUNTER — Ambulatory Visit: Admitting: Family Medicine

## 2024-10-02 VITALS — BP 120/80 | HR 65 | Temp 97.7°F | Ht 62.75 in | Wt 219.8 lb

## 2024-10-02 DIAGNOSIS — Z23 Encounter for immunization: Secondary | ICD-10-CM

## 2024-10-02 DIAGNOSIS — R7303 Prediabetes: Secondary | ICD-10-CM

## 2024-10-02 DIAGNOSIS — E876 Hypokalemia: Secondary | ICD-10-CM

## 2024-10-02 LAB — POCT GLYCOSYLATED HEMOGLOBIN (HGB A1C): Hemoglobin A1C: 5.2 % (ref 4.0–5.6)

## 2024-10-02 NOTE — Progress Notes (Signed)
 Established Patient Office Visit  Subjective   Patient ID: Tasha Valencia, female    DOB: [birth year]  Age: 53 y.o. MRN: [id number]  Chief Complaint  Patient presents with   Medical Management of Chronic Issues    HPI Discussed the use of AI scribe software for clinical note transcription with the patient, who gave verbal consent to proceed.  History of Present Illness   Tasha Valencia is a 53 year old female who presents for follow-up on weight management and potassium level monitoring.  She experiences occasional fatigue and mild constipation. She denies stomach pain but has a burning sensation in her abdomen, possibly due to acid reflux. She has lost 27 pounds, reducing her weight from 246 to 219 pounds, since starting her weight loss journey. She has been on the maximum dose of Wegovy  since June 2025. Her weight loss has been gradual. She takes potassium supplements at 40 mEq daily. Her last potassium level was 2.9 mEq/L, and she is due for a repeat test today. Her A1c level is 5.2%. She experiences variability in her appetite, with some days having no appetite and other days feeling unable to get full, possibly related to the timing of her Wegovy  injections. She engages in physical activity by walking at least three days a week and using resistance bands a couple of days a week. Her diet includes green vegetables, occasional fruits, and high protein.       Current Outpatient Medications  Medication Instructions   CALCIUM PO 600 mg, Daily   chlorthalidone  (HYGROTON ) 25 mg, Oral, Daily   Cholecalciferol (VITAMIN D -3) 25 MCG (1000 UT) CAPS Daily   cyanocobalamin (VITAMIN B12) 500 mcg, Daily   Ferrous Sulfate (IRON) 325 (65 Fe) MG TABS Daily   potassium chloride  SA (KLOR-CON  M) 20 MEQ tablet 40 mEq, Oral, Daily   POTASSIUM PO Daily   Wegovy  2.4 mg, Subcutaneous, Weekly    Patient Active Problem List   Diagnosis Date Noted   Carpal tunnel syndrome, right upper limb 06/24/2022    Numbness and tingling in right hand 05/21/2022   Obstructive sleep apnea 05/15/2021   Uterine fibroid 06/04/2019   Leiomyoma of uterus, unspecified 05/31/2019   Routine general medical examination at a health care facility 06/01/2012   Contraceptive device, intrauterine 06/01/2012   Prediabetes 02/07/2012   Dyslipidemia 02/07/2012   Hypertension 02/07/2012   Vitamin D  deficiency 09/16/2011   MORBID OBESITY 10/09/2008     Review of Systems  All other systems reviewed and are negative.     Objective:     BP 120/80   Pulse 65   Temp 97.7 F (36.5 C) (Oral)   Ht 5' 2.75 (1.594 m)   Wt 219 lb 12.8 oz (99.7 kg)   LMP 05/04/2019   SpO2 98%   BMI 39.25 kg/m    Physical Exam Vitals reviewed.  Constitutional:      Appearance: Normal appearance. She is obese.  Cardiovascular:     Rate and Rhythm: Normal rate and regular rhythm.     Heart sounds: Normal heart sounds. No murmur heard. Pulmonary:     Effort: Pulmonary effort is normal.     Breath sounds: Normal breath sounds.  Neurological:     Mental Status: She is alert and oriented to person, place, and time. Mental status is at baseline.      Results for orders placed or performed in visit on 10/02/24  POC HgB A1c  Result Value Ref Range   Hemoglobin A1C  5.2 4.0 - 5.6 %   HbA1c POC (<> result, manual entry)     HbA1c, POC (prediabetic range)     HbA1c, POC (controlled diabetic range)        The 10-year ASCVD risk score (Arnett DK, et al., 2019) is: 4.2%    Assessment & Plan:  Immunization due -     Flu vaccine trivalent PF, 6mos and older(Flulaval,Afluria,Fluarix,Fluzone)  Prediabetes -     POCT glycosylated hemoglobin (Hb A1C) -     Collection capillary blood specimen  Hypokalemia -     Basic metabolic panel with GFR; Future  MORBID OBESITY   Assessment and Plan    Obesity Obesity with significant weight loss of 27 pounds since starting Wegovy , over 10% of her body weight. Weight loss is  occurring at a safe and steady pace, preferred to avoid complications such as muscle mass loss and rhabdomyolysis. She is engaging in regular exercise and using resistance bands, although dietary habits are inconsistent, she is continuing to avoid processed foods/excessive carbs.  Wegovy  is effectively aiding in weight loss, and she is on the maximum dose since June. - Continue Wegovy  and prepare for reauthorization in November - Encourage continued exercise and healthy eating habits, pt voiced understanding that she will continue with these things while on the wegovy .   Hypokalemia Previous hypokalemia with a potassium level of 2.9. Currently taking potassium supplements at 40 mEq daily. - Order repeat potassium level test - Continue potassium supplementation at 40 mEq daily  General Health Maintenance Received flu vaccination.        Return in about 6 months (around 04/02/2025).    Heron CHRISTELLA Sharper, MD

## 2024-10-03 LAB — BASIC METABOLIC PANEL WITH GFR
BUN: 12 mg/dL (ref 6–23)
CO2: 31 meq/L (ref 19–32)
Calcium: 9.1 mg/dL (ref 8.4–10.5)
Chloride: 100 meq/L (ref 96–112)
Creatinine, Ser: 0.77 mg/dL (ref 0.40–1.20)
GFR: 87.49 mL/min (ref >60.00)
Glucose, Bld: 69 mg/dL — ABNORMAL LOW (ref 70–99)
Potassium: 3.4 meq/L — ABNORMAL LOW (ref 3.5–5.1)
Sodium: 139 meq/L (ref 135–145)

## 2024-10-05 ENCOUNTER — Ambulatory Visit: Payer: Self-pay | Admitting: Family Medicine

## 2024-10-12 ENCOUNTER — Telehealth: Payer: Self-pay

## 2024-10-12 ENCOUNTER — Other Ambulatory Visit (HOSPITAL_COMMUNITY): Payer: Self-pay

## 2024-10-12 NOTE — Telephone Encounter (Signed)
 Pharmacy Patient Advocate Encounter  Received notification from [facility] that Prior Authorization for Wegovy  0.25 has been APPROVED from 10/12/24 to 10/12/25. Unable to obtain price due to refill too soon rejection, last fill date 09/29/24 next available fill date10/18/25. [facility] pharmacies not contracted with patient plan.   PA #/Case ID/Reference #: # [id number]

## 2024-10-12 NOTE — Telephone Encounter (Signed)
 Clinical questions have been answered and PA submitted. PA currently Pending. Please be advised that most companies allow up to 30 days to make a decision. We will advise when a determination has been made, or follow up in 1 week.   Please reach out to our team, Rx Prior Auth Pool, if you haven't heard back in a week.

## 2024-10-12 NOTE — Telephone Encounter (Signed)
 Pharmacy Patient Advocate Encounter   Received notification from Onbase that prior authorization for Wegovy  0.25 is due for renewal.   Insurance verification completed.   The patient is insured through [facility].  Action: PA required; PA started via CoverMyMeds. KEY [id number] . Waiting for clinical questions to populate.

## 2024-10-16 NOTE — Telephone Encounter (Signed)
 Error

## 2024-10-29 ENCOUNTER — Other Ambulatory Visit: Payer: Self-pay | Admitting: Family Medicine

## 2024-10-29 DIAGNOSIS — Z1231 Encounter for screening mammogram for malignant neoplasm of breast: Secondary | ICD-10-CM

## 2024-11-26 ENCOUNTER — Ambulatory Visit: Admission: RE | Admit: 2024-11-26 | Discharge: 2024-11-26 | Disposition: A | Source: Ambulatory Visit

## 2024-11-26 DIAGNOSIS — Z1231 Encounter for screening mammogram for malignant neoplasm of breast: Secondary | ICD-10-CM

## 2024-11-29 ENCOUNTER — Other Ambulatory Visit: Payer: Self-pay | Admitting: Family Medicine

## 2024-11-29 ENCOUNTER — Ambulatory Visit: Payer: Self-pay | Admitting: Family Medicine

## 2024-11-29 DIAGNOSIS — I10 Essential (primary) hypertension: Secondary | ICD-10-CM

## 2024-12-28 ENCOUNTER — Other Ambulatory Visit: Payer: Self-pay | Admitting: Family Medicine

## 2024-12-31 ENCOUNTER — Encounter: Payer: Self-pay | Admitting: Family Medicine
